# Patient Record
Sex: Male | Born: 1935
Health system: Southern US, Community
[De-identification: ages and names within clinical notes are randomized; demographics above are authoritative.]

## PROBLEM LIST (undated history)

## (undated) DIAGNOSIS — G20A1 Parkinson's disease without dyskinesia, without mention of fluctuations: Secondary | ICD-10-CM

## (undated) DIAGNOSIS — N400 Enlarged prostate without lower urinary tract symptoms: Secondary | ICD-10-CM

## (undated) DIAGNOSIS — I10 Essential (primary) hypertension: Secondary | ICD-10-CM

## (undated) DIAGNOSIS — E785 Hyperlipidemia, unspecified: Secondary | ICD-10-CM

## (undated) DIAGNOSIS — F0280 Dementia in other diseases classified elsewhere without behavioral disturbance: Secondary | ICD-10-CM

## (undated) DIAGNOSIS — G20 Parkinson's disease: Secondary | ICD-10-CM

## (undated) HISTORY — DX: Hyperlipidemia, unspecified: E78.5

## (undated) HISTORY — DX: Parkinson's disease without dyskinesia, without mention of fluctuations: G20.A1

## (undated) HISTORY — DX: Essential (primary) hypertension: I10

## (undated) HISTORY — PX: TONSILLECTOMY: SUR1361

## (undated) HISTORY — DX: Parkinson's disease: G20

## (undated) HISTORY — DX: Benign prostatic hyperplasia without lower urinary tract symptoms: N40.0

## (undated) HISTORY — DX: Dementia in other diseases classified elsewhere, unspecified severity, without behavioral disturbance, psychotic disturbance, mood disturbance, and anxiety: F02.80

---

## 2013-06-09 DIAGNOSIS — G20 Parkinson's disease: Secondary | ICD-10-CM | POA: Diagnosis not present

## 2013-06-09 DIAGNOSIS — E785 Hyperlipidemia, unspecified: Secondary | ICD-10-CM | POA: Diagnosis not present

## 2013-08-25 DIAGNOSIS — G20 Parkinson's disease: Secondary | ICD-10-CM | POA: Diagnosis not present

## 2013-12-04 DIAGNOSIS — Z23 Encounter for immunization: Secondary | ICD-10-CM | POA: Diagnosis not present

## 2014-01-08 DIAGNOSIS — G20 Parkinson's disease: Secondary | ICD-10-CM | POA: Diagnosis not present

## 2014-01-08 DIAGNOSIS — R413 Other amnesia: Secondary | ICD-10-CM | POA: Diagnosis not present

## 2014-05-16 DIAGNOSIS — G20 Parkinson's disease: Secondary | ICD-10-CM | POA: Diagnosis not present

## 2014-05-16 DIAGNOSIS — R413 Other amnesia: Secondary | ICD-10-CM | POA: Diagnosis not present

## 2014-05-21 DIAGNOSIS — E785 Hyperlipidemia, unspecified: Secondary | ICD-10-CM | POA: Diagnosis not present

## 2014-08-20 ENCOUNTER — Telehealth: Payer: Self-pay | Admitting: Behavioral Health

## 2014-08-20 NOTE — Telephone Encounter (Signed)
Unable to reach patient at time of Pre-Visit Call.  Left message for patient to return call when available.    

## 2014-08-21 ENCOUNTER — Telehealth: Payer: Self-pay | Admitting: Family Medicine

## 2014-08-21 ENCOUNTER — Ambulatory Visit: Payer: Medicare Other | Admitting: Family Medicine

## 2014-08-21 DIAGNOSIS — Z0289 Encounter for other administrative examinations: Secondary | ICD-10-CM

## 2014-08-23 NOTE — Telephone Encounter (Signed)
Pt was no show 08/21/14 8:30am, new pt, pt rescheduled new pt with Selena Batten 09/14/14, pt came in late (got lost) does not want to be charged (wife was crying)/jms/ rescheduled, charge for no show?

## 2014-08-23 NOTE — Telephone Encounter (Signed)
Ok to charge 

## 2014-09-13 ENCOUNTER — Telehealth: Payer: Self-pay | Admitting: Behavioral Health

## 2014-09-13 ENCOUNTER — Encounter: Payer: Self-pay | Admitting: Behavioral Health

## 2014-09-13 NOTE — Telephone Encounter (Signed)
Unable to reach patient at time of Pre-Visit Call.  Left message for patient to return call when available.    

## 2014-09-13 NOTE — Telephone Encounter (Signed)
Pre-Visit Call completed with patient and chart updated.   Pre-Visit Info documented in Specialty Comments under [employer].    

## 2014-09-13 NOTE — Addendum Note (Signed)
Addended by: Melanee Spry on: 09/13/2014 10:56 AM   Modules accepted: Medications

## 2014-09-14 ENCOUNTER — Encounter: Payer: Self-pay | Admitting: Physician Assistant

## 2014-09-14 ENCOUNTER — Ambulatory Visit (INDEPENDENT_AMBULATORY_CARE_PROVIDER_SITE_OTHER): Payer: Medicare Other | Admitting: Physician Assistant

## 2014-09-14 VITALS — BP 132/78 | HR 71 | Temp 98.5°F | Resp 18 | Ht 71.0 in | Wt 188.0 lb

## 2014-09-14 DIAGNOSIS — G20 Parkinson's disease: Secondary | ICD-10-CM | POA: Diagnosis not present

## 2014-09-14 DIAGNOSIS — F0280 Dementia in other diseases classified elsewhere without behavioral disturbance: Secondary | ICD-10-CM | POA: Diagnosis not present

## 2014-09-14 DIAGNOSIS — G20A1 Parkinson's disease without dyskinesia, without mention of fluctuations: Secondary | ICD-10-CM

## 2014-09-14 DIAGNOSIS — N400 Enlarged prostate without lower urinary tract symptoms: Secondary | ICD-10-CM

## 2014-09-14 MED ORDER — SILODOSIN 4 MG PO CAPS: 4.0000 mg | ORAL_CAPSULE | Freq: Every day | ORAL | Status: DC

## 2014-09-14 NOTE — Progress Notes (Signed)
Pre visit review using our clinic review tool, if applicable. No additional management support is needed unless otherwise documented below in the visit note/SLS  

## 2014-09-14 NOTE — Patient Instructions (Signed)
Please continue medications as directed but stop the Flomax and begin Rapaflo as directed.    You will be contacted for assessment by Neurology. Please keep your phone on.  Follow-up in 1 month.

## 2014-09-14 NOTE — Progress Notes (Signed)
Patient presents to clinic today to establish care.  Acute Concerns: BPH with nocturia. Patient and wife note patient has been on Flomax for urinary symptoms for > 5 years. Previously worked well but now with some increased nocturia. No change to daytime urination. Wife worried about falls due to increased nighttime bathroom trips as patient with Parkinson's. Denies dysuria, hematuria, pain. Symptoms have been steady for > 6 months.  Chronic Issues: Parkinson's Disease -- requesting referral to Neurology in the area. Currently on Sinemet IR as directed. Is currently on Aricept for dementia associated with this chronic disease. Denies recent fall but there are still gait issues due to advancing disease.   Past Medical History  Diagnosis Date  . Parkinson's disease   . Enlarged prostate   . Hyperlipidemia   . Hypertension     Past Surgical History  Procedure Laterality Date  . Tonsillectomy      Current Outpatient Prescriptions on File Prior to Visit  Medication Sig Dispense Refill  . calcium carbonate (OS-CAL) 600 MG TABS tablet Take 600 mg by mouth 2 (two) times daily with a meal.    . carbidopa-levodopa (SINEMET IR) 25-100 MG per tablet Take 2 tablets by mouth 3 (three) times daily.     . Cholecalciferol (VITAMIN D-3 PO) Take by mouth at bedtime.    . donepezil (ARICEPT) 10 MG tablet Take 10 mg by mouth daily.     . Multiple Vitamins-Minerals (MULTIVITAMIN ADULT PO) Take by mouth daily.    . Naproxen Sodium (ALEVE PO) Take by mouth 2 (two) times daily.    . rosuvastatin (CRESTOR) 20 MG tablet Take 20 mg by mouth daily.    . tamsulosin (FLOMAX) 0.4 MG CAPS capsule Take 0.4 mg by mouth at bedtime.      No current facility-administered medications on file prior to visit.    Allergies  Allergen Reactions  . Mirapex [Pramipexole Dihydrochloride] Swelling    Family History  Problem Relation Age of Onset  . Lupus Daughter   . Healthy Daughter     x2    Social History     Social History  . Marital Status: Married    Spouse Name: N/A  . Number of Children: N/A  . Years of Education: N/A   Occupational History  . Not on file.   Social History Main Topics  . Smoking status: Never Smoker   . Smokeless tobacco: Not on file  . Alcohol Use: Not on file  . Drug Use: Not on file  . Sexual Activity: Not on file   Other Topics Concern  . Not on file   Social History Narrative   Review of Systems  Constitutional: Negative for fever and weight loss.  Cardiovascular: Negative for chest pain and palpitations.  Gastrointestinal: Negative for heartburn, nausea, vomiting, abdominal pain, diarrhea, constipation, blood in stool and melena.  Genitourinary: Positive for frequency. Negative for dysuria, urgency, hematuria and flank pain.  Neurological: Negative for dizziness and loss of consciousness.  Psychiatric/Behavioral: Positive for memory loss. The patient is not nervous/anxious.     BP 132/78 mmHg  Pulse 71  Temp(Src) 98.5 F (36.9 C) (Oral)  Resp 18  Ht 5\' 11"  (1.803 m)  Wt 188 lb (85.276 kg)  BMI 26.23 kg/m2  SpO2 96%  Physical Exam  Constitutional: He is well-developed, well-nourished, and in no distress.  HENT:  Head: Normocephalic and atraumatic.  Eyes: Conjunctivae are normal.  Neck: Neck supple.  Cardiovascular: Normal rate, regular rhythm, normal heart  sounds and intact distal pulses.   Pulmonary/Chest: Effort normal and breath sounds normal.  Abdominal: Soft. Bowel sounds are normal.  Neurological: He is alert.  Psychiatric: Affect normal.  Vitals reviewed.   No results found for this or any previous visit (from the past 2160 hour(s)).  Assessment/Plan: No problem-specific assessment & plan notes found for this encounter.

## 2014-09-17 DIAGNOSIS — F0280 Dementia in other diseases classified elsewhere without behavioral disturbance: Secondary | ICD-10-CM | POA: Insufficient documentation

## 2014-09-17 DIAGNOSIS — G20 Parkinson's disease: Secondary | ICD-10-CM | POA: Insufficient documentation

## 2014-09-17 DIAGNOSIS — N400 Enlarged prostate without lower urinary tract symptoms: Secondary | ICD-10-CM | POA: Insufficient documentation

## 2014-09-17 NOTE — Assessment & Plan Note (Signed)
Continue Sinemet. Referral to Neurology placed.

## 2014-09-17 NOTE — Assessment & Plan Note (Signed)
Will attempt trial of Rapaflo 4 mg nightly. Supportive measures reviewed. If not improving recommend Urology referral for assessment giving advancing dementia.

## 2014-09-19 ENCOUNTER — Telehealth: Payer: Self-pay | Admitting: Neurology

## 2014-09-19 NOTE — Telephone Encounter (Signed)
Will get release when patient comes to appt.

## 2014-09-19 NOTE — Telephone Encounter (Signed)
Consuella Lose from [facility] called in regards to PT and the request for medical records and she needs a signed medical release form and she can get them faxed over to you/Dawn CB# [phone] Ext:[phone]

## 2014-09-21 ENCOUNTER — Telehealth: Payer: Self-pay | Admitting: *Deleted

## 2014-09-21 NOTE — Telephone Encounter (Signed)
Consuella Lose called requesting a signed release form so that she can send patient's records.  Fax: [phone]

## 2014-09-21 NOTE — Telephone Encounter (Signed)
Will send when patient is seen next week.

## 2014-09-27 ENCOUNTER — Encounter: Payer: Self-pay | Admitting: Neurology

## 2014-09-27 ENCOUNTER — Ambulatory Visit (INDEPENDENT_AMBULATORY_CARE_PROVIDER_SITE_OTHER): Payer: Medicare Other | Admitting: Neurology

## 2014-09-27 VITALS — BP 118/80 | HR 68 | Ht 70.0 in | Wt 192.0 lb

## 2014-09-27 DIAGNOSIS — G20 Parkinson's disease: Secondary | ICD-10-CM

## 2014-09-27 DIAGNOSIS — F0280 Dementia in other diseases classified elsewhere without behavioral disturbance: Secondary | ICD-10-CM
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
## 2014-10-12 ENCOUNTER — Ambulatory Visit (INDEPENDENT_AMBULATORY_CARE_PROVIDER_SITE_OTHER): Payer: Medicare Other | Admitting: Physician Assistant

## 2014-10-12 ENCOUNTER — Encounter: Payer: Self-pay | Admitting: Physician Assistant

## 2014-10-12 VITALS — BP 142/82 | HR 68 | Temp 98.3°F | Resp 18 | Ht 70.0 in | Wt 191.2 lb

## 2014-10-12 DIAGNOSIS — N400 Enlarged prostate without lower urinary tract symptoms: Secondary | ICD-10-CM

## 2014-10-12 DIAGNOSIS — Z23 Encounter for immunization: Secondary | ICD-10-CM

## 2014-10-12 MED ORDER — SILODOSIN 4 MG PO CAPS: 4.0000 mg | ORAL_CAPSULE | Freq: Every day | ORAL | Status: DC

## 2014-10-12 NOTE — Patient Instructions (Signed)
You were given your flu shot today.  I am glad you are doing better on the Rapaflo. Please continue the medication as directed. Follow-up if symptoms deteriorate.

## 2014-10-12 NOTE — Progress Notes (Signed)
Pre visit review using our clinic review tool, if applicable. No additional management support is needed unless otherwise documented below in the visit note/SLS  

## 2014-10-12 NOTE — Progress Notes (Signed)
    Patient presents to clinic today for follow-up of BPH after starting Rapaflo 4 mg nightly. Is taking as directed. Notes is working better than his Flomax without making him lightheaded. Endorses nocturia at 1, down from 4 times nightly. Is happy with results.  Needs flu shot. Will be given today.  Past Medical History  Diagnosis Date  . Parkinson's disease   . Enlarged prostate   . Hyperlipidemia   . Hypertension     Current Outpatient Prescriptions on File Prior to Visit  Medication Sig Dispense Refill  . calcium carbonate (OS-CAL) 600 MG TABS tablet Take 600 mg by mouth 2 (two) times daily with a meal.    . carbidopa-levodopa (SINEMET IR) 25-100 MG per tablet Take 2 tablets by mouth 3 (three) times daily.     . Cholecalciferol (VITAMIN D-3 PO) Take by mouth at bedtime.    . docusate sodium (COLACE) 100 MG capsule Take 300 mg by mouth daily.    Marland Kitchen donepezil (ARICEPT) 10 MG tablet Take 10 mg by mouth daily.     . Multiple Vitamins-Minerals (MULTIVITAMIN ADULT PO) Take by mouth daily.    . Naproxen Sodium (ALEVE PO) Take by mouth 2 (two) times daily.    . rosuvastatin (CRESTOR) 20 MG tablet Take 20 mg by mouth daily.     No current facility-administered medications on file prior to visit.    Allergies  Allergen Reactions  . Mirapex [Pramipexole Dihydrochloride] Swelling    Family History  Problem Relation Age of Onset  . Lupus Daughter   . Healthy Daughter     x2    Social History   Social History  . Marital Status: Married    Spouse Name: N/A  . Number of Children: N/A  . Years of Education: N/A   Social History Main Topics  . Smoking status: Never Smoker   . Smokeless tobacco: None  . Alcohol Use: 0.0 oz/week    0 Standard drinks or equivalent per week     Comment: 2 glasses of wine weekly, varies  . Drug Use: No  . Sexual Activity: Not Asked   Other Topics Concern  . None   Social History Narrative    Review of Systems - See HPI.  All other ROS are  negative.  BP 142/82 mmHg  Pulse 68  Temp(Src) 98.3 F (36.8 C) (Oral)  Resp 18  Ht  (1.778 m)  Wt 191 lb 4 oz (86.75 kg)  BMI 27.44 kg/m2  SpO2 95%  Physical Exam  Constitutional: He is oriented to person, place, and time and well-developed, well-nourished, and in no distress.  HENT:  Head: Normocephalic and atraumatic.  Eyes: Conjunctivae are normal.  Pulmonary/Chest: Effort normal and breath sounds normal.  Neurological: He is alert and oriented to person, place, and time.  Skin: Skin is warm and dry. No rash noted.  Psychiatric: Affect normal.  Vitals reviewed.   No results found for this or any previous visit (from the past 2160 hour(s)).  Assessment/Plan: BPH (benign prostatic hyperplasia)  Improvement in symptoms with Rapaflo. Will continue current regimen. Follow-up if symptoms deteriorate.

## 2014-10-12 NOTE — Assessment & Plan Note (Signed)
Improvement in symptoms with Rapaflo. Will continue current regimen. Follow-up if symptoms deteriorate.

## 2014-11-05 NOTE — Telephone Encounter (Signed)
Pt wife was told today by Dr. Abner GreenspanBlyth that the $50 would be waived.

## 2014-12-28 ENCOUNTER — Encounter: Payer: Self-pay | Admitting: Neurology

## 2014-12-28 ENCOUNTER — Ambulatory Visit (INDEPENDENT_AMBULATORY_CARE_PROVIDER_SITE_OTHER): Payer: Medicare Other | Admitting: Neurology

## 2014-12-28 VITALS — BP 150/84 | HR 96 | Wt 204.0 lb

## 2014-12-28 DIAGNOSIS — G20 Parkinson's disease: Secondary | ICD-10-CM

## 2014-12-28 DIAGNOSIS — F0280 Dementia in other diseases classified elsewhere without behavioral disturbance: Secondary | ICD-10-CM | POA: Diagnosis not present

## 2014-12-28 DIAGNOSIS — G20A1 Parkinson's disease without dyskinesia, without mention of fluctuations: Secondary | ICD-10-CM

## 2014-12-28 MED ORDER — CARBIDOPA-LEVODOPA 25-100 MG PO TABS: 2.0000 | ORAL_TABLET | Freq: Three times a day (TID) | ORAL | Status: DC

## 2014-12-28 MED ORDER — DONEPEZIL HCL 10 MG PO TABS: 10.0000 mg | ORAL_TABLET | Freq: Every day | ORAL | Status: DC

## 2014-12-28 NOTE — Patient Instructions (Signed)
1. You have been referred to [facility]. They will call you directly to schedule an appointment.  Please call [phone] if you do not hear from them.    
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
## 2015-01-23 ENCOUNTER — Ambulatory Visit: Payer: Medicare Other | Admitting: Occupational Therapy

## 2015-01-23 ENCOUNTER — Ambulatory Visit: Payer: Medicare Other | Admitting: Physical Therapy

## 2015-01-23 ENCOUNTER — Encounter: Payer: Medicare Other | Admitting: Speech Pathology

## 2015-02-19 ENCOUNTER — Ambulatory Visit: Payer: Medicare Other | Admitting: Speech Pathology

## 2015-02-19 ENCOUNTER — Ambulatory Visit: Payer: Medicare Other | Attending: Neurology | Admitting: Occupational Therapy

## 2015-02-19 ENCOUNTER — Ambulatory Visit: Payer: Medicare Other | Admitting: Physical Therapy

## 2015-02-19 DIAGNOSIS — R2689 Other abnormalities of gait and mobility: Secondary | ICD-10-CM | POA: Diagnosis not present

## 2015-02-19 DIAGNOSIS — R29898 Other symptoms and signs involving the musculoskeletal system: Secondary | ICD-10-CM

## 2015-02-19 DIAGNOSIS — R278 Other lack of coordination: Secondary | ICD-10-CM

## 2015-02-19 DIAGNOSIS — R41841 Cognitive communication deficit: Secondary | ICD-10-CM

## 2015-02-19 DIAGNOSIS — R258 Other abnormal involuntary movements: Secondary | ICD-10-CM | POA: Diagnosis not present

## 2015-02-19 DIAGNOSIS — R269 Unspecified abnormalities of gait and mobility: Secondary | ICD-10-CM | POA: Insufficient documentation

## 2015-02-19 DIAGNOSIS — R2681 Unsteadiness on feet: Secondary | ICD-10-CM | POA: Diagnosis not present

## 2015-02-19 DIAGNOSIS — R471 Dysarthria and anarthria: Secondary | ICD-10-CM | POA: Insufficient documentation

## 2015-02-19 DIAGNOSIS — R293 Abnormal posture: Secondary | ICD-10-CM

## 2015-02-19 DIAGNOSIS — R4189 Other symptoms and signs involving cognitive functions and awareness: Secondary | ICD-10-CM | POA: Insufficient documentation

## 2015-02-19 DIAGNOSIS — R279 Unspecified lack of coordination: Secondary | ICD-10-CM | POA: Insufficient documentation

## 2015-02-19 NOTE — Patient Instructions (Signed)
  Loud "AH" 5x twice a day  Count to 10 Days of week Months of year   SLOW and BIG   Cognitive Activities you can do at home:   - Solitaire  - Majong  - Scrabble  - Chess/Checkers  - Crosswords (easy level)  - Education officer, community  - Card Games  - Board Games  - Connect 4  - Simon  - the [employer]  On your computer, tablet or phone:  Metallurgist it out [employer] the Word?App

## 2015-02-19 NOTE — Therapy (Signed)
[facility] [address] [place], [place], [address] Phone: [phone]   Fax:  [phone]  Occupational Therapy Evaluation  Patient Details  Name: Jacob Tran MRN: [id number] Date of Birth: 01/06/1934 Referring Provider: Dr. Lurena Joiner Tat  Encounter Date: 02/19/2015      OT End of Session - 02/19/15 2042    Visit Number 1   Number of Visits 17   Date for OT Re-Evaluation 04/19/15   Authorization Type Medicare/BCBS, need G code   Authorization - Visit Number 1   Authorization - Number of Visits 10   OT Start Time 1538   OT Stop Time 1625   OT Time Calculation (min) 47 min   Activity Tolerance Patient tolerated treatment well   Behavior During Therapy Flat affect  decr initiation      Past Medical History  Diagnosis Date  . Parkinson's disease (HCC)   . Enlarged prostate   . Hyperlipidemia   . Hypertension     Past Surgical History  Procedure Laterality Date  . Tonsillectomy      There were no vitals filed for this visit.  Visit Diagnosis:  Bradykinesia  Rigidity  Decreased coordination  Decreased functional mobility  Unsteadiness  Cognitive deficits  Posture abnormality      Subjective Assessment - 02/19/15 1542    Subjective  Pt's wife reports primary concern with memory and walking.  Pt does not initiate conversation and only answers with short phases when asked about ADLs or states that "I don't remember"    Patient is accompained by: Family member  wife   Pertinent History PD (diagnosed since 2010); Parkinson's disease dementia, hx of "back problems"   Limitations PD dementia   Patient Stated Goals improve speech, walking, and decr time for ADLs   Currently in Pain? No/denies           [facility] OT Assessment - 02/19/15 1543    Assessment   Diagnosis Parkinson's disease with Parkinson's disease dementia   Referring Provider Dr. Lurena Joiner Tat   Onset Date --  2010 initially  diagnosed   Prior Therapy none for PD   Precautions   Precautions Fall  PD dementia   Balance Screen   Has the patient fallen in the past 6 months Yes   How many times? 1  slid off toilet   Home  Environment   Family/patient expects to be discharged to: Private residence   Home Layout --  no stairs, townhouse   Lives With Spouse   Prior Function   Level of Independence Independent with household mobility without device;Independent with basic ADLs   Vocation Retired   Leisure Per wife, he plays Management consultant.  Has exercise bike at home, but does not use.   ADL   Eating/Feeding Modified independent  min spills, incr time   Grooming --  incr time, occasional assist with shaving chinline   Upper Body Bathing Modified independent   Lower Body Bathing Modified independent   Upper Body Dressing Increased time   Lower Body Dressing Increased time   Toilet Tranfer Modified independent   Toileting - Clothing Manipulation Modified independent   Toileting -  Hygiene Modified Independent   Tub/Shower Transfer Modified independent   Tub/Shower Transfer Equipment --  has shower seat but does not use   IADL   Prior Level of Function Shopping wife performs    Prior Level of Function Light Housekeeping wife performs, and has been for years   Light Housekeeping --  pt makes bed   Prior Level of Function Meal Prep wife performs   Meal Prep --  microwave, simple snack prep   Community Mobility --  wife drives   Medication Management Takes responsibility if medication is prepared in advance in seperate dosage  with supervision   Financial Management --  wife performs   Mobility   Mobility Status Freezing;History of falls   Mobility Status Comments Ambulates without device, but per wife pt shuffles/walks on toes and freezes when walking (more with fatigue per wife)   Written Expression   Dominant Hand Right   Handwriting Mild micrographia  100% initially, decr the more pt writes    Vision - History   Baseline Vision Bifocals   Vision Assessment   Diplopia Assessment --  denies   Cognition   Overall Cognitive Status --  PD dementia, also see ST eval, min cues for directions   Area of Impairment Orientation;Attention;Memory;Awareness  difficulty with unafamiliar activiites, decr initiation   General Comments only answers in short phases, or states that he doesn't remember/know, does not initiate speech, or does not answer or looks to wife   Behaviors --  decr initiation   Observation/Other Assessments   Observations Pt with forward stooped posture and leans to the right when standing   Other Surveys  Select   Outcome Measures Buttoning/unbuttoning 3 buttons on table in 42.0sec   Physical Performance Test   Yes   Simulated Eating Time (seconds) 17.63   Donning Doffing Jacket Time (seconds) 24.03  however, when donning after assessment, appeared to freeze    Coordination   9 Hole Peg Test Right;Left   Right 9 Hole Peg Test 50.75sec   Left 9 Hole Peg Test 45.28   Box and Blocks R-38blocks, L-30blocks   Tremors controlled by meds per wife   Tone   Assessment Location Right Upper Extremity;Left Upper Extremity   ROM / Strength   AROM / PROM / Strength AROM   AROM   Overall AROM  Within functional limits for tasks performed   Overall AROM Comments however, needed cueing for full R elbow extension and shoulder flex/abduction   RUE Tone   RUE Tone Moderate   LUE Tone   LUE Tone --  min-mod                         OT Education - 02/19/15 2017    Education provided Yes   Education Details results of eval and POC; importance of exercise; recommended daily exercise and ways to incorporate it into daily routine; education that wife may need to cue pt due to decr initiation/apathy with PD and memory deficits   Person(s) Educated Patient;Spouse   Methods Explanation   Comprehension Verbalized understanding          OT Short Term Goals -  02/19/15 2053    OT SHORT TERM GOAL #1   Title Pt/wife will be independent with PD-specific HEP.--check STGs 03/20/15   Time 4   Period Weeks   Status New   OT SHORT TERM GOAL #2   Title Pt/wife will verbalize understanding of ways to prevent future complications and appropriate community resources.   Time 4   Period Weeks   Status New   OT SHORT TERM GOAL #3   Title Pt will improve coordination for ADLs as shown by improving time on 9-hole peg test by at least 4sec bilaterally.   Baseline R-50.75sec, L-45.28sec   Time 4  Period Weeks   Status New           OT Long Term Goals - 2015/03/03 06/03/54    OT LONG TERM GOAL #1   Title Pt/wife will verbalize understanding of AE/strategies to incr ease with ADLs prn.--check LTGs 04/19/15   Time 8   Period Weeks   Status New   OT LONG TERM GOAL #2   Title Pt will improve functional reaching for ADLs as shown by improving score on box and blocks test by at least 5 with LUE.   Baseline 30 blocks   Period Weeks   Status New   OT LONG TERM GOAL #3   Title Pt will improve coordination for ADLs as shown by improving time on 9-hole peg test by at least 8 sec with RUE.   Baseline 50.75sec   Period Weeks   Status New   OT LONG TERM GOAL #4   Title Pt will be able to fasten/unfasten 3 buttons on table by at least 4sec.   Baseline 42.0sec   Time 8   Period Weeks   Status New   OT LONG TERM GOAL #5   Title Pt will demo decr bradykinesia as shown by demo full R elbow extension with overhead reaching at least 90% of the time.   Time 8   Period Weeks   Status New               Plan - 03-Mar-2015 06-03-43    Clinical Impression Statement Pt is a 82 y.o. male with diagnosis of Parkinson's disease and PD dementia.  Wife reports hx of "back problems" as well.  Pt presents with bradykinesia, rigidity, decr posture, decr balance/functional mobility for ADLs, decr coordination, and cognitive deficits.  Pt would benefit from occupational therapy to  address these deficit to improve ease with ADLs, prevent future complications, establish PD-specific HEP, and reduce caregiver burden.   Pt will benefit from skilled therapeutic intervention in order to improve on the following deficits (Retired) Decreased cognition;Decreased mobility;Decreased strength;Impaired UE functional use;Decreased knowledge of use of DME;Decreased balance;Decreased activity tolerance;Impaired tone;Decreased coordination;Decreased range of motion  bradykinesia   Rehab Potential Good   Clinical Impairments Affecting Rehab Potential PD dementia/cognitive deficits, decr initiation   OT Frequency 2x / week   OT Duration 8 weeks  +eval   OT Treatment/Interventions Self-care/ADL training;Therapeutic exercise;Functional Mobility Training;Patient/family education;Balance training;Splinting;Manual Therapy;Neuromuscular education;Ultrasound;Energy conservation;Therapeutic exercises;Therapeutic activities;DME and/or AE instruction;Cryotherapy;Electrical Stimulation;Fluidtherapy;Cognitive remediation/compensation;Visual/perceptual remediation/compensation;Passive range of motion;Contrast Bath;Moist Heat   Plan initiate PWR! HEP in sitting, PWR! hands   Recommended Other Services also evaluated by ST and PT 03/03/2015   Consulted and Agree with Plan of Care Patient          G-Codes - 03-03-2015 06/02/2101    Functional Assessment Tool Used 9-hole peg test:  R-50.75sec, L-45.28sec; box and blocks test:  R-38, L-30 blocks, only approx 90% R shoulder flex and elbow ext without cueing   Functional Limitation Carrying, moving and handling objects   Carrying, Moving and Handling Objects Current Status [phone]) At least 40 percent but less than 60 percent impaired, limited or restricted   Carrying, Moving and Handling Objects Goal Status ([id number]) At least 20 percent but less than 40 percent impaired, limited or restricted      Problem List Patient Active Problem List   Diagnosis Date Noted  .  Parkinson's disease dementia (HCC) 09/17/2014  . BPH (benign prostatic hyperplasia) 09/17/2014  . Parkinson's disease (HCC) 09/17/2014    [facility] 2015-03-03, 9:06  PM  [facility] [address] [place], [place], [address] Phone: [phone]   Fax:  [phone]  Name: Jacob Tran MRN: [id number] Date of Birth: 1933/02/19  Willa Frater, OTR/L 02/19/2015 9:08 PM

## 2015-02-19 NOTE — Therapy (Signed)
[facility] [address] [place], [place], [address] Phone: [phone]   Fax:  [phone]  Speech Language Pathology Evaluation  Patient Details  Name: Jacob Tran MRN: [id number] Date of Birth: 11-Jun-1933 Referring Provider: Nyoka Lint Tat  Encounter Date: 02/19/2015      End of Session - 02/19/15 1454    Visit Number 1   Number of Visits 17   Date for SLP Re-Evaluation 04/16/15   Activity Tolerance Patient tolerated treatment well      Past Medical History  Diagnosis Date  . Parkinson's disease (HCC)   . Enlarged prostate   . Hyperlipidemia   . Hypertension     Past Surgical History  Procedure Laterality Date  . Tonsillectomy      There were no vitals filed for this visit.  Visit Diagnosis: Dysarthria - Plan: SLP plan of care cert/re-cert  Cognitive communication deficit - Plan: SLP plan of care cert/re-cert      Subjective Assessment - 02/19/15 1416    Subjective "He sometimes won't talk to me at all"   Currently in Pain? No/denies            SLP Evaluation [facility] - 02/19/15 1416    SLP Visit Information   SLP Received On 02/19/15   Referring Provider Rebeccal Tat   Onset Date 08/2008   Medical Diagnosis Parkinson's Disease   Subjective   Subjective "He will go all day and not say 10 words to me"   Patient/Family Stated Goal "I just want him to talk to me"   General Information   Mobility Status walks independently - PT evaluated today   Prior Functional Status   Cognitive/Linguistic Baseline Baseline deficits   Type of Home Apartment    Lives With Spouse   Available Support Family   Vocation Retired   IT consultant   Overall Cognitive Status Impaired/Different from baseline   Area of Impairment Orientation;Attention;Memory;Awareness   Reading Comprehension   Reading Status Within funtional limits   Expression   Primary Mode of Expression Verbal   Verbal Expression   Overall Verbal  Expression Impaired   Initiation Impaired   Automatic Speech --  WFL   Repetition No impairment   Naming No impairment   Pragmatics Impairment   Written Expression   Dominant Hand Right   Written Expression Within Functional Limits   Oral Motor/Sensory Function   Overall Oral Motor/Sensory Function Impaired   Labial ROM Within Functional Limits   Lingual Coordination Reduced   Velum Within Functional Limits  hypernasality noted   Motor Speech   Overall Motor Speech Impaired   Respiration Within functional limits   Phonation Low vocal intensity   Resonance Hypernasality   Articulation Impaired   Level of Impairment Phrase   Intelligibility Intelligibility reduced   Word 75-100% accurate   Phrase 75-100% accurate   Sentence 50-74% accurate   Conversation 50-74% accurate   Motor Planning Witnin functional limits   Motor Speech Errors Unaware   Interfering Components Hearing loss   Effective Techniques Slow rate;Increased vocal intensity                         SLP Education - 02/19/15 1454    Education provided Yes   Education Details cogntive activities to do at home, loud /a/, goals for therapy   Person(s) Educated Patient;Spouse   Methods Explanation;Demonstration;Handout   Comprehension Verbalized understanding;Need further instruction;Verbal cues required  SLP Short Term Goals - 02/19/15 1607    SLP SHORT TERM GOAL #1   Title Pt. will demonstrate loud /a/ average of 86dB over 3 sessions.   Time 4   Period Weeks   Status New   SLP SHORT TERM GOAL #2   Title Pt will demonstrate average of 70 dB 85% of structured speech tasks with rare min A   Time 4   Period Weeks   Status New   SLP SHORT TERM GOAL #3   Title Pt will respond with 3-4 word utterances during structured speech tasks with occasional min A   Time 4   Period Weeks   Status New   SLP SHORT TERM GOAL #4   Title Pt will initiate social greeting in the clinic 1x over 3  sessions with rare min A.   Time 4   Period Weeks   Status New          SLP Long Term Goals - 02/19/15 1610    SLP LONG TERM GOAL #1   Title Pt will average 70dB during simple conversation over 5 minutes with occasional min A   Time 8   Period Weeks   Status New   SLP LONG TERM GOAL #2   Title Pt will request help or tool needed (ie pen, paper, calculator etc) during structured task with rare min A.   Time 8   Period Weeks   Status New   SLP LONG TERM GOAL #3   Title Pt will use 3-4 word utterances in simple conversation over 5 minutes with occasional min cues.    Time 8   Period Weeks   Status New          Plan - 02/19/15 1537    Clinical Impression Statement Mr. Grassia, a 82 y.o. male diagnosed with Parkinson's disease in 08/2008 presents today with moderate hypokinetic dysarthria and reduced initiation of verbal expression. A sound level meter placed 30cm from pt's mouth revealed an average of  68dB at phrase level, answering conversational questions.(70dB is [facility] for conversation) Mr. Stankowski's loud /a/ averaged 77dB initially. Upon instruction and occasional min cues, this increased to an average of 85dB.  Rapid rate of speech and hypernasality are noted on naming tasks and in simple conversation. He is judged to be 85% intelligible in quiet environment. Lingual tremors and discoordination noted. Velum elevated symmetrically.    Mrs. Riehl's primary complaint re: her husbands speech and language is his extermely sparse verbal output. She reports tearfully that  he says less than 10 words to her during a day. Assessment of Mr. Callaham's language reveal naming,repetition, reading, writing to dictation to be relatively intact for simple tasks . He performed simple abstract reasoning with occasional min cues.   Mr. Frieden does have h/o PD induced dementia with memory impairment. He was not oriented to the year or date. Long term memory also appeared impaired  When I asked about personal  events of his life.  Mr. Wimer demonstrated adequate sustained and selective attention on written task (sentence unscrambles).  Mr. Prout required mod to max cues to elaborate on answers, providing usually 1-2 word answers or remaining silent for open ended questions.  His  verbal output is not consistent with this relatively intact language , comprehension and attention, I suspect sparse communciative interaction  and severely reduced length of utterance is due to Parkinson's induced apathy.  Mrs. Caissie [name]) became tearful duirng this evaluation due to her husbands lack of  interaction and lack of participation in daily activities at home. She reports he won't leave the house and "plays video games all day." I recommend skilled ST to maximize this pt's intelligibility and verbal expression to reduce caregiver burden. Consider PD support group.   Speech Therapy Frequency 2x / week   Duration --  8 weeks   Treatment/Interventions Cognitive reorganization;Internal/external aids;Compensatory strategies;SLP instruction and feedback;Patient/family education;Functional tasks;Multimodal communcation approach   Potential to Achieve Goals Fair   Potential Considerations Cooperation/participation level;Medical prognosis   Consulted and Agree with Plan of Care Patient;Family member/caregiver   Family Member Consulted spouse, Clois Comber - 2015-02-22 [id number]    Functional Assessment Tool Used NOMS   Functional Limitations Motor speech   Motor Speech Current Status [phone]) At least 40 percent but less than 60 percent impaired, limited or restricted   Motor Speech Goal Status [phone]) At least 40 percent but less than 60 percent impaired, limited or restricted      Problem List Patient Active Problem List   Diagnosis Date Noted  . Parkinson's disease dementia (HCC) 09/17/2014  . BPH (benign prostatic hyperplasia) 09/17/2014  . Parkinson's disease (HCC) 09/17/2014    Lovvorn, Radene Journey MS,  CCC-SLP February 22, 2015, 4:17 PM  [place] [facility] [address] [place], [place], [address] Phone: [phone]   Fax:  [phone]  Name: Kepler Mccabe MRN: [id number] Date of Birth: 05/28/1933

## 2015-02-20 NOTE — Therapy (Signed)
[facility] [address] [place], [place], [address] Phone: [phone]   Fax:  [phone]  Physical Therapy Evaluation  Patient Details  Name: Jacob Tran MRN: [id number] Date of Birth: 10/05/1933 Referring Provider: Kerin Salen  Encounter Date: 02/19/2015      PT End of Session - 02/20/15 1535    Visit Number 1   Number of Visits 9   Date for PT Re-Evaluation 04/20/15   Authorization Type Medicare/BCBS-GCODE every 10th visit   PT Start Time 1318   PT Stop Time 1402   PT Time Calculation (min) 44 min   Equipment Utilized During Treatment Gait belt   Activity Tolerance Patient tolerated treatment well   Behavior During Therapy [facility] for tasks assessed/performed      Past Medical History  Diagnosis Date  . Parkinson's disease (HCC)   . Enlarged prostate   . Hyperlipidemia   . Hypertension     Past Surgical History  Procedure Laterality Date  . Tonsillectomy      There were no vitals filed for this visit.  Visit Diagnosis:  Posture abnormality  Bradykinesia  Rigidity  Abnormality of gait  Postural instability      Subjective Assessment - 02/19/15 1322    Subjective Pt is a 82 year old male who presents to OP PT with Parkinson's disease, who has had one fall in the pst 6 months.  That fall was trying to get off the toilet.  Pt does not use assistive device.  He has walker and cane, but does not use.   Patient is accompained by: Family member  wife   Patient Stated Goals Pt's goal is to help with gait-wife wants to help with shuffling gait, to stop sliding feet along.   Currently in Pain? No/denies            [facility] PT Assessment - 02/20/15 1527    Assessment   Medical Diagnosis Parkinson's disease   Referring Provider Tat, Lurena Joiner   Onset Date/Surgical Date --  moved to [facility] June 2016   Precautions   Precautions Fall  PD dementia   Balance Screen   Has the patient fallen in the  past 6 months Yes   How many times? 1   Has the patient had a decrease in activity level because of a fear of falling?  No   Is the patient reluctant to leave their home because of a fear of falling?  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  Has exercise bike at home, does not use.   Posture/Postural Control   Posture/Postural Control Postural limitations   Postural Limitations Rounded Shoulders;Flexed trunk;Forward head   ROM / Strength   AROM / PROM / Strength Strength   Strength   Overall Strength Comments Grossly tested at least 4/5 throughout bilateral lower extremities   Transfers   Transfers Sit to Stand;Stand to Sit   Sit to Stand 5: Supervision   Sit to Stand Details (indicate cue type and reason) Prefers use of UEs, but  is able to perform 5x sit<>stand   Five time sit to stand comments  11.81 sec = 2.77 ft/sec   Stand to Sit 5: Supervision   Comments Wife reports difficulty getting up from recliners and chairs at home; she reports increased reliance on use of hands then  needs family assistance.   Ambulation/Gait   Ambulation/Gait Yes   Ambulation/Gait Assistance 4: Min guard   Ambulation Distance (Feet) 220 Feet   Assistive device None   Gait Pattern Step-through pattern;Decreased arm swing - right;Decreased arm swing - left;Decreased step length - right;Decreased step length - left;Lateral hip instability;Decreased trunk rotation;Lateral trunk lean to right;Trunk flexed;Poor foot clearance - left;Poor foot clearance - right  Lateral L hip excursion  in stance phase   Ambulation Surface Level;Indoor   Gait velocity 10.28   Gait Comments Pt has increased forward flexed posture and increased shuffling type gait pattern as gait progressed in 2 minutes of walking.   Standardized Balance Assessment   Standardized Balance Assessment Timed Up and Go Test;Dynamic Gait Index   Dynamic Gait Index   Level Surface Mild Impairment   Change in Gait Speed Mild Impairment   Gait with Horizontal Head Turns Moderate Impairment   Gait with Vertical Head Turns Moderate Impairment   Gait and Pivot Turn Mild Impairment   Step Over Obstacle Moderate Impairment   Step Around Obstacles Moderate Impairment   Steps Mild Impairment   Total Score 12   DGI comment: Scores <19/24 are indicative of increased fall risk.   Timed Up and Go Test   Normal TUG (seconds) 14.66   Cognitive TUG (seconds) 16.62   TUG Comments Scores >13.5 sec and 15 sec (cognitive) are indicative of increased fall risk.                                PT Long Term Goals - 02/20/15 1546    PT LONG TERM GOAL #1   Title Pt will perform HEP with family's assistance for improved posture, balance, gait.  TARGET 03/21/15   Time 4   Period Weeks   Status New   PT LONG TERM GOAL #2   Title Pt will improve Timed Up and Go test to less than or equal to 13.5 seconds for decreased fall risk.   Time 4   Period Weeks   Status New   PT LONG TERM GOAL #3   Title Pt will improve Dynamic Gait Index to at least 15/24 for decreased fall risk.   Time 4   Period Weeks   Status New   PT LONG TERM GOAL #4   Title Pt will tolerate at least 5 minutes of walking activities without loss of balance due to increased forward flexed posture and shuffling gait.   Time 4   Period Weeks   Status New   PT LONG TERM GOAL #5   Title Pt/wife will verbalize understanidng of fall prevention within the home environment.   Time 4   Period Weeks   Status New   Additional Long Term Goals    Additional Long Term Goals Yes   PT LONG TERM GOAL #6   Title Pt/wife will verbalize understanding of local Parkinson's disease related resources, including community fitness options.   Time 4   Period Weeks   Status New   PT LONG TERM GOAL #7   Title Pt will perform at least 8 of 10 reps of sit<>stand transfers  with minimal to no UE support from 18 inch surfaces and lower, for improved efficiency and safety with transfers.   Time 4   Period Weeks   Status New               Plan - 02/20/15 1536    Clinical Impression Statement Pt is a 82 year old male who presents to OP PT with history of Parkinson's disease, with one fall in the past 6 months.  Wife reports difficulty getting up from chairs and increasing incidence of shuffling gait and forward flexed posture which she is afraid will lead to falls.  Pt presents to therapy eval with the following personal factors/co-morbidities:  PD dementia, HTN, Hyperlipidemia.  Wife also reports they moved from [place] to [place] in June 2016; she also reports decline in his independent exercise habits since wife fractured her foot in late 2015.  Pt presents with abnormal posture, shuffling gait/decreased timing and coordination of gait, decreased balance, decreased functional activity tolerance, limited participation in exercise activities within the community.  Pt's clinical presentation appears to be stable.  Pt does present as fall risk per TUG, TUG cognitive and Dynamic Gait Index scores.  Pt would benefit from skilled PT to address the above stated deficits to improve safety with functional mobility and decrease fall risk.   Pt will benefit from skilled therapeutic intervention in order to improve on the following deficits Abnormal gait;Decreased activity tolerance;Decreased balance;Decreased mobility;Decreased endurance;Decreased safety awareness;Decreased strength;Difficulty walking;Postural dysfunction   Rehab Potential Good   PT Frequency 2x  / week   PT Duration 4 weeks   PT Treatment/Interventions ADLs/Self Care Home Management;Therapeutic exercise;Therapeutic activities;Functional mobility training;Gait training;Balance training;Neuromuscular re-education;Patient/family education   PT Next Visit Plan Initiate HEP-posture stretching and strengthening, balance activities to improve step length and weightshifting   Consulted and Agree with Plan of Care Patient;Family member/caregiver   Family Member Consulted wife          G-Codes - 2015-03-08 1551    Functional Assessment Tool Used TUG 14.66 sec, TUG cog 16.62 sec, Dynamic Gait Index score 12/24   Functional Limitation Mobility: Walking and moving around   Mobility: Walking and Moving Around Current Status [phone]) At least 40 percent but less than 60 percent impaired, limited or restricted   Mobility: Walking and Moving Around Goal Status [phone]) At least 20 percent but less than 40 percent impaired, limited or restricted       Problem List Patient Active Problem List   Diagnosis Date Noted  . Parkinson's disease dementia (HCC) 09/17/2014  . BPH (benign prostatic hyperplasia) 09/17/2014  . Parkinson's disease (HCC) 09/17/2014    Daylee Delahoz W. 02/20/2015, 3:55 PM Gean Maidens., PT  [facility] [address] [place], [place], [address] Phone: [phone]   Fax:  [phone]  Name: Jacob Tran MRN: [id number] Date of Birth: [birth year]

## 2015-02-25 ENCOUNTER — Ambulatory Visit: Payer: Medicare Other | Attending: Neurology | Admitting: *Deleted

## 2015-02-25 ENCOUNTER — Encounter: Payer: Self-pay | Admitting: *Deleted

## 2015-02-25 VITALS — HR 88

## 2015-02-25 DIAGNOSIS — R2689 Other abnormalities of gait and mobility: Secondary | ICD-10-CM

## 2015-02-25 DIAGNOSIS — R293 Abnormal posture: Secondary | ICD-10-CM | POA: Diagnosis not present

## 2015-02-25 DIAGNOSIS — R471 Dysarthria and anarthria: Secondary | ICD-10-CM | POA: Insufficient documentation

## 2015-02-25 DIAGNOSIS — R279 Unspecified lack of coordination: Secondary | ICD-10-CM | POA: Insufficient documentation

## 2015-02-25 DIAGNOSIS — R41841 Cognitive communication deficit: Secondary | ICD-10-CM | POA: Diagnosis not present

## 2015-02-25 DIAGNOSIS — R4189 Other symptoms and signs involving cognitive functions and awareness: Secondary | ICD-10-CM | POA: Insufficient documentation

## 2015-02-25 DIAGNOSIS — R278 Other lack of coordination: Secondary | ICD-10-CM

## 2015-02-25 DIAGNOSIS — R258 Other abnormal involuntary movements: Secondary | ICD-10-CM | POA: Insufficient documentation

## 2015-02-25 DIAGNOSIS — R269 Unspecified abnormalities of gait and mobility: Secondary | ICD-10-CM | POA: Diagnosis not present

## 2015-02-25 DIAGNOSIS — R29898 Other symptoms and signs involving the musculoskeletal system: Secondary | ICD-10-CM | POA: Diagnosis not present

## 2015-02-25 NOTE — Therapy (Signed)
[facility] [address] [place], [place], [address] Phone: [phone]   Fax:  [phone]  Physical Therapy Treatment  Patient Details  Name: Jacob Tran MRN: [id number] Date of Birth: 05-29-1933 Referring Provider: Kerin Salen  Encounter Date: 02/25/2015      PT End of Session - 02/25/15 1542    Visit Number 2   Number of Visits 9   Date for PT Re-Evaluation 04/20/15   Authorization Type Medicare/BCBS-GCODE every 10th visit   PT Start Time 1240   PT Stop Time 1325   PT Time Calculation (min) 45 min   Equipment Utilized During Treatment Gait belt   Activity Tolerance Patient tolerated treatment well   Behavior During Therapy [facility] for tasks assessed/performed      Past Medical History  Diagnosis Date  . Parkinson's disease (HCC)   . Enlarged prostate   . Hyperlipidemia   . Hypertension     Past Surgical History  Procedure Laterality Date  . Tonsillectomy      Filed Vitals:   02/25/15 1505 02/25/15 1508  Pulse: 61 88    Visit Diagnosis:  Decreased coordination  Decreased functional mobility      Subjective Assessment - 02/25/15 1508    Subjective Doesn't remember using the bike at the previous exercise facility.   Patient is accompained by: Family member   Currently in Pain? No/denies   Multiple Pain Sites No                         [facility] Adult PT Treatment/Exercise - 02/25/15 1511    Ambulation/Gait   Ambulation/Gait Yes   Ambulation/Gait Assistance 5: Supervision   Ambulation Distance (Feet) 250 Feet   Assistive device None   Gait Pattern Step-through pattern;Decreased arm swing - right;Decreased arm swing - left;Decreased step length - right;Decreased step length - left;Lateral hip instability;Decreased trunk rotation;Lateral trunk lean to right;Trunk flexed;Poor foot clearance - left;Poor foot clearance - right   Ambulation Surface Level   Gait Comments Pt has increased  forward flexed posture and increased shuffling type gait pattern as gait progressed in 2 minutes of walking.   Exercises   Exercises Knee/Hip;Ankle;Other Exercises   Knee/Hip Exercises: Aerobic   Other Aerobic sci fit continuous   Knee/Hip Exercises: Standing   Heel Raises --   Knee Flexion --   Hip Abduction --   Functional Squat --   Ankle Exercises: Standing   Toe Raise 10 reps             Balance Exercises - 02/25/15 1532    OTAGO PROGRAM   Hip ABductor 10 reps   Ankle Plantorflexors 20 reps, support   Ankle Dorsiflexors 20 reps, support   Knee Bends 10 reps, support           PT Education - 02/25/15 1537    Education provided Yes   Education Details initiated HEP- part of ORTAGA program   Person(s) Educated Spouse   Methods Explanation;Demonstration;Handout   Comprehension Verbalized understanding;Returned demonstration             PT Long Term Goals - 02/20/15 1546    PT LONG TERM GOAL #1   Title Pt will perform HEP with family's assistance for improved posture, balance, gait.  TARGET 03/21/15   Time 4   Period Weeks   Status New   PT LONG TERM GOAL #2   Title Pt will improve Timed Up and Go test to  less than or equal to 13.5 seconds for decreased fall risk.   Time 4   Period Weeks   Status New   PT LONG TERM GOAL #3   Title Pt will improve Dynamic Gait Index to at least 15/24 for decreased fall risk.   Time 4   Period Weeks   Status New   PT LONG TERM GOAL #4   Title Pt will tolerate at least 5 minutes of walking activities without loss of balance due to increased forward flexed posture and shuffling gait.   Time 4   Period Weeks   Status New   PT LONG TERM GOAL #5   Title Pt/wife will verbalize understanidng of fall prevention within the home environment.   Time 4   Period Weeks   Status New   Additional Long Term Goals   Additional Long Term Goals Yes   PT LONG TERM GOAL #6   Title Pt/wife will verbalize understanding of local  Parkinson's disease related resources, including community fitness options.   Time 4   Period Weeks   Status New   PT LONG TERM GOAL #7   Title Pt will perform at least 8 of 10 reps of sit<>stand transfers with minimal to no UE support from 18 inch surfaces and lower, for improved efficiency and safety with transfers.   Time 4   Period Weeks   Status New               Plan - 02/25/15 1546    Clinical Impression Statement skilled session focused on initiating HEP and increasing activity tolerance to exercise to progress mobility and strength deficits.   Pt will benefit from skilled therapeutic intervention in order to improve on the following deficits Abnormal gait;Decreased activity tolerance;Decreased balance;Decreased mobility;Decreased endurance;Decreased safety awareness;Decreased strength;Difficulty walking;Postural dysfunction   Rehab Potential Good   PT Frequency 2x / week   PT Duration 4 weeks   PT Treatment/Interventions ADLs/Self Care Home Management;Therapeutic exercise;Therapeutic activities;Functional mobility training;Gait training;Balance training;Neuromuscular re-education;Patient/family education   PT Next Visit Plan Review HEP and  focus treatment on posture stretching and strengthening, balance activities to improve step length and weightshifting   Consulted and Agree with Plan of Care Patient;Family member/caregiver   Family Member Consulted wife        Problem List Patient Active Problem List   Diagnosis Date Noted  . Parkinson's disease dementia (HCC) 09/17/2014  . BPH (benign prostatic hyperplasia) 09/17/2014  . Parkinson's disease (HCC) 09/17/2014   Hortencia Conradi, PTA  02/25/2015, 4:00 PM  [place] [address] [place], [place], [address] Phone: [phone]   Fax:  [phone]  Name: Jacob Tran MRN: [id number] Date of Birth: [birth year]

## 2015-02-27 ENCOUNTER — Ambulatory Visit: Payer: Medicare Other

## 2015-02-27 ENCOUNTER — Ambulatory Visit: Payer: Medicare Other | Admitting: Physical Therapy

## 2015-02-27 DIAGNOSIS — R4189 Other symptoms and signs involving cognitive functions and awareness: Secondary | ICD-10-CM | POA: Diagnosis not present

## 2015-02-27 DIAGNOSIS — R293 Abnormal posture: Secondary | ICD-10-CM

## 2015-02-27 DIAGNOSIS — R29898 Other symptoms and signs involving the musculoskeletal system: Secondary | ICD-10-CM | POA: Diagnosis not present

## 2015-02-27 DIAGNOSIS — R2689 Other abnormalities of gait and mobility: Secondary | ICD-10-CM | POA: Diagnosis not present

## 2015-02-27 DIAGNOSIS — R41841 Cognitive communication deficit: Secondary | ICD-10-CM

## 2015-02-27 DIAGNOSIS — R471 Dysarthria and anarthria: Secondary | ICD-10-CM | POA: Diagnosis not present

## 2015-02-27 DIAGNOSIS — R258 Other abnormal involuntary movements: Secondary | ICD-10-CM | POA: Diagnosis not present

## 2015-02-27 DIAGNOSIS — R269 Unspecified abnormalities of gait and mobility: Secondary | ICD-10-CM

## 2015-02-27 DIAGNOSIS — R279 Unspecified lack of coordination: Secondary | ICD-10-CM | POA: Diagnosis not present

## 2015-02-27 NOTE — Therapy (Signed)
[facility] [address] [place], [place], [address] Phone: [phone]   Fax:  [phone]  Physical Therapy Treatment  Patient Details  Name: Jacob Tran MRN: [id number] Date of Birth: Aug 12, 1933 Referring Provider: Kerin Salen  Encounter Date: 02/27/2015      PT End of Session - 02/27/15 1551    Visit Number 3   Number of Visits 9   Date for PT Re-Evaluation 04/20/15   Authorization Type Medicare/BCBS-GCODE every 10th visit   PT Start Time 1318   PT Stop Time 1402   PT Time Calculation (min) 44 min   Activity Tolerance Patient tolerated treatment well   Behavior During Therapy [facility] for tasks assessed/performed      Past Medical History  Diagnosis Date  . Parkinson's disease (HCC)   . Enlarged prostate   . Hyperlipidemia   . Hypertension     Past Surgical History  Procedure Laterality Date  . Tonsillectomy      There were no vitals filed for this visit.  Visit Diagnosis:  Posture abnormality  Bradykinesia  Abnormality of gait  Postural instability      Subjective Assessment - 02/27/15 1321    Subjective Nothing new to report.     Patient is accompained by: Family member   Patient Stated Goals Pt's goal is to help with gait-wife wants to help with shuffling gait, to stop sliding feet along.   Currently in Pain? No/denies                         [facility] Adult PT Treatment/Exercise - 02/27/15 0001    Ambulation/Gait   Ambulation/Gait Yes   Ambulation/Gait Assistance 5: Supervision   Ambulation Distance (Feet) 120 Feet  x 2, then 100 ft   Assistive device None   Gait Pattern Step-through pattern;Decreased arm swing - right;Decreased arm swing - left;Decreased step length - right;Decreased step length - left;Lateral hip instability;Decreased trunk rotation;Lateral trunk lean to right;Trunk flexed;Poor foot clearance - left;Poor foot clearance - right  Pt holds hands clasped  behind back   Ambulation Surface Level;Indoor   Gait Comments Pt's initial steps upon initiating gait are small, shuffling, festinating steps.  Pt requires cues to stop, reset posture and start gait again with larger step pattern.     Exercises   Exercises Knee/Hip;Ankle;Other Exercises   Knee/Hip Exercises: Aerobic   Other Aerobic Sci Fit Level 1.5>1.8, 8 minutes with cues to keep RPM >70-80 for improved intensity of movement  O2 sats 93%>95%, HR 84 bpm after exercise             Balance Exercises - 02/27/15 1321    Balance Exercises: Standing   Other Standing Exercises Lateral weightshifting at counter with UE support x 10 reps>progressing to rocking and reaching at counter x 5 reps.  Standing at doorframe-postural stretch for improved upright posture, x 5 reps 5-10 second hold.   OTAGO PROGRAM   Knee Flexor 10 reps   Hip ABductor 10 reps   Ankle Plantorflexors 20 reps, support   Ankle Dorsiflexors 20 reps, support   Knee Bends 10 reps, support   Backwards Walking Support  Forwards/backwards 3 reps at counter with support   Sideways Walking Assistive device   Overall OTAGO Comments The above exercises have been provided as part of HEP   Balance Exercises: Seated   Other Seated Exercises Seated posture exercises-PWR! Up x 10 reps with cues for scapular squeeze and breathing sequence  PT Education - 02/27/15 1352    Education provided Yes   Education Details HEP-additions-see instructions   Person(s) Educated Patient;Spouse   Methods Explanation;Demonstration;Handout   Comprehension Verbalized understanding;Returned demonstration             PT Long Term Goals - 02/20/15 1546    PT LONG TERM GOAL #1   Title Pt will perform HEP with family's assistance for improved posture, balance, gait.  TARGET 03/21/15   Time 4   Period Weeks   Status New   PT LONG TERM GOAL #2   Title Pt will improve Timed Up and Go test to less than or equal to 13.5 seconds for  decreased fall risk.   Time 4   Period Weeks   Status New   PT LONG TERM GOAL #3   Title Pt will improve Dynamic Gait Index to at least 15/24 for decreased fall risk.   Time 4   Period Weeks   Status New   PT LONG TERM GOAL #4   Title Pt will tolerate at least 5 minutes of walking activities without loss of balance due to increased forward flexed posture and shuffling gait.   Time 4   Period Weeks   Status New   PT LONG TERM GOAL #5   Title Pt/wife will verbalize understanidng of fall prevention within the home environment.   Time 4   Period Weeks   Status New   Additional Long Term Goals   Additional Long Term Goals Yes   PT LONG TERM GOAL #6   Title Pt/wife will verbalize understanding of local Parkinson's disease related resources, including community fitness options.   Time 4   Period Weeks   Status New   PT LONG TERM GOAL #7   Title Pt will perform at least 8 of 10 reps of sit<>stand transfers with minimal to no UE support from 18 inch surfaces and lower, for improved efficiency and safety with transfers.   Time 4   Period Weeks   Status New               Plan - 02/27/15 1552    Clinical Impression Statement Pt appears to have been performing exercises with wife's assistance at home, as part of HEP from first PT visit.  During this session, pt requires frequent cues to reset upright posture during standing activities.  Pt tolerates postural stretch well at doorframe and discussed how this can be incorporated more frequently through the day at home for improved postural strengthening.  Pt will continue to benefit from further skilled PT to address posture, balance, gait deficits.   Pt will benefit from skilled therapeutic intervention in order to improve on the following deficits Abnormal gait;Decreased activity tolerance;Decreased balance;Decreased mobility;Decreased endurance;Decreased safety awareness;Decreased strength;Difficulty walking;Postural dysfunction    Rehab Potential Good   PT Frequency 2x / week   PT Duration 4 weeks   PT Treatment/Interventions ADLs/Self Care Home Management;Therapeutic exercise;Therapeutic activities;Functional mobility training;Gait training;Balance training;Neuromuscular re-education;Patient/family education   PT Next Visit Plan Review additions to HEP; discuss techniques to decreased freezing with gait/turns and practice initiating gait with weightshifting   Consulted and Agree with Plan of Care Patient;Family member/caregiver   Family Member Consulted wife        Problem List Patient Active Problem List   Diagnosis Date Noted  . Parkinson's disease dementia (HCC) 09/17/2014  . BPH (benign prostatic hyperplasia) 09/17/2014  . Parkinson's disease (HCC) 09/17/2014    Dior Stepter W. 02/27/2015, 3:56 PM  Gean Maidens PT [facility] [address] [place], [place], [address] Phone: [phone]   Fax:  [phone]  Name: Jacob Tran MRN: [id number] Date of Birth: 1933-09-13

## 2015-02-27 NOTE — Patient Instructions (Addendum)
Scapular Retraction (Standing)    Stand at the doorframe and try to "stand as tall as the wall".  Try to keep your shoulders up and head up, with arms at sides, pinch shoulder blades together. Repeat _10___ times per set, holding 10-15 seconds in your best tall posture position each time.  Do _3-4___ sessions per day.  http://orth.exer.us/944   Copyright  VHI. All rights reserved.   Also provided OTAGO exercises-sidestepping along counter, forward/back walking along counter.

## 2015-02-28 NOTE — Patient Instructions (Signed)
Complete loud "ah" 5 times, twice a day, 6 days a week

## 2015-02-28 NOTE — Therapy (Signed)
[facility] [address] [place], [place], [address] Phone: [phone]   Fax:  [phone]  Speech Language Pathology Treatment  Patient Details  Name: Jacob Tran MRN: [id number] Date of Birth: 10/22/1933 Referring Provider: Nyoka Lint Tat  Encounter Date: 02/27/2015      End of Session - 02/28/15 1311    Visit Number 2   Number of Visits 17   Date for SLP Re-Evaluation 04/16/15   SLP Start Time 1403   SLP Stop Time  1445   SLP Time Calculation (min) 42 min   Activity Tolerance Patient tolerated treatment well;Patient limited by lethargy      Past Medical History  Diagnosis Date  . Parkinson's disease (HCC)   . Enlarged prostate   . Hyperlipidemia   . Hypertension     Past Surgical History  Procedure Laterality Date  . Tonsillectomy      There were no vitals filed for this visit.  Visit Diagnosis: Dysarthria  Cognitive communication deficit      Subjective Assessment - 02/27/15 1405    Subjective (p) Pt arrives with wife, Jacob Tran.                ADULT SLP TREATMENT - 02/28/15 1241    General Information   Behavior/Cognition Cooperative;Pleasant mood;Confused;Lethargic;Requires cueing  decr'd initiation   Treatment Provided   Treatment provided Cognitive-Linquistic   Pain Assessment   Pain Assessment No/denies pain   Cognitive-Linquistic Treatment   Treatment focused on Dysarthria   Skilled Treatment SLP worked for approx 10 minutes shaping pt's loud /a/ as pt began with volume at average 76dB and approx 3 seconds. With demonstration and verbal cues pt maintained loud /a/ over 5 trials with usual min A for loudness at average 83dB and 7 seconds. In strucutred question and answer tasks pt's loudness increased to 67dB and usual min A for encr of loudness/effort. SLP educated pt/pt's wife on necessity to accomplish loud /a/ at least 6 days/week. Pt with no initiation even with high interest  topic ([employer] fiction).    Assessment / Recommendations / Plan   Plan Continue with current plan of care   Progression Toward Goals   Progression toward goals Progressing toward goals          SLP Education - 02/28/15 1311    Education provided Yes   Education Details need to do loud /a/ 6 days a week, 5 reps, twice a day, loud /a/ shaping   Person(s) Educated Patient;Spouse   Methods Explanation;Demonstration;Verbal cues   Comprehension Verbalized understanding;Returned demonstration;Verbal cues required;Need further instruction          SLP Short Term Goals - 02/28/15 1313    SLP SHORT TERM GOAL #1   Title Pt. will demonstrate loud /a/ average of 86dB over 3 sessions.   Time 4   Period Weeks   Status On-going   SLP SHORT TERM GOAL #2   Title Pt will demonstrate average of 70 dB 85% of structured speech tasks with rare min A   Time 4   Period Weeks   Status On-going   SLP SHORT TERM GOAL #3   Title Pt will respond with 3-4 word utterances during structured speech tasks with occasional min A   Time 4   Period Weeks   Status On-going   SLP SHORT TERM GOAL #4   Title Pt will initiate social greeting in the clinic 1x over 3 sessions with rare min A.   Time 4  Period Weeks   Status On-going          SLP Long Term Goals - 02/28/15 1314    SLP LONG TERM GOAL #1   Title Pt will average 70dB during simple conversation over 5 minutes with occasional min A   Time 8   Period Weeks   Status On-going   SLP LONG TERM GOAL #2   Title Pt will request help or tool needed (ie pen, paper, calculator etc) during structured task with rare min A.   Time 8   Period Weeks   Status On-going   SLP LONG TERM GOAL #3   Title Pt will use 3-4 word utterances in simple conversation over 5 minutes with occasional min cues.    Time 8   Period Weeks   Status On-going          Plan - 02/28/15 1312    Clinical Impression Statement Pt requires usual cues for loudness in structured  tasks. His decr'd cognitive-linguistic skills hinder his overall progress today. He requires skilled ST to cont to improve his loudness in conversation/responding to a speaker, and to provide wife with compnsations to ease caregiver burden re: communcication.   Speech Therapy Frequency 2x / week   Duration --  8 weeks   Treatment/Interventions Cognitive reorganization;Internal/external aids;Compensatory strategies;SLP instruction and feedback;Patient/family education;Functional tasks;Multimodal communcation approach   Potential to Achieve Goals Fair   Potential Considerations Cooperation/participation level;Medical prognosis   Consulted and Agree with Plan of Care Patient;Family member/caregiver   Family Member Consulted spouse, Jacob Tran        Problem List Patient Active Problem List   Diagnosis Date Noted  . Parkinson's disease dementia (HCC) 09/17/2014  . BPH (benign prostatic hyperplasia) 09/17/2014  . Parkinson's disease (HCC) 09/17/2014    [facility] ,MS, CCC-SLP  02/28/2015, 1:14 PM  [facility] [address] [place], [place], [address] Phone: [phone]   Fax:  [phone]   Name: Jacob Tran MRN: [id number] Date of Birth: November 18, 1933

## 2015-03-05 ENCOUNTER — Ambulatory Visit: Payer: Medicare Other

## 2015-03-05 ENCOUNTER — Ambulatory Visit: Payer: Medicare Other | Admitting: Rehabilitative and Restorative Service Providers"

## 2015-03-05 ENCOUNTER — Ambulatory Visit: Payer: Medicare Other | Admitting: Occupational Therapy

## 2015-03-05 DIAGNOSIS — R279 Unspecified lack of coordination: Secondary | ICD-10-CM | POA: Diagnosis not present

## 2015-03-05 DIAGNOSIS — R293 Abnormal posture: Secondary | ICD-10-CM

## 2015-03-05 DIAGNOSIS — R29898 Other symptoms and signs involving the musculoskeletal system: Secondary | ICD-10-CM

## 2015-03-05 DIAGNOSIS — R4189 Other symptoms and signs involving cognitive functions and awareness: Secondary | ICD-10-CM | POA: Diagnosis not present

## 2015-03-05 DIAGNOSIS — R269 Unspecified abnormalities of gait and mobility: Secondary | ICD-10-CM

## 2015-03-05 DIAGNOSIS — R258 Other abnormal involuntary movements: Secondary | ICD-10-CM

## 2015-03-05 DIAGNOSIS — R41841 Cognitive communication deficit: Secondary | ICD-10-CM

## 2015-03-05 DIAGNOSIS — R471 Dysarthria and anarthria: Secondary | ICD-10-CM

## 2015-03-05 DIAGNOSIS — R2689 Other abnormalities of gait and mobility: Secondary | ICD-10-CM | POA: Diagnosis not present

## 2015-03-05 DIAGNOSIS — R278 Other lack of coordination: Secondary | ICD-10-CM

## 2015-03-05 NOTE — Therapy (Signed)
[facility] [address] [place], [place], [address] Phone: [phone]   Fax:  [phone]  Physical Therapy Treatment  Patient Details  Name: Jacob Tran MRN: [id number] Date of Birth: 1933-10-14 Referring Provider: Kerin Salen  Encounter Date: 03/05/2015      PT End of Session - 03/05/15 2031    Visit Number 4   Number of Visits 9   Date for PT Re-Evaluation 04/20/15   Authorization Type Medicare/BCBS-GCODE every 10th visit   PT Start Time 1150   PT Stop Time 1230   PT Time Calculation (min) 40 min   Equipment Utilized During Treatment Gait belt   Activity Tolerance Patient tolerated treatment well   Behavior During Therapy [facility] for tasks assessed/performed      Past Medical History  Diagnosis Date  . Parkinson's disease (HCC)   . Enlarged prostate   . Hyperlipidemia   . Hypertension     Past Surgical History  Procedure Laterality Date  . Tonsillectomy      There were no vitals filed for this visit.  Visit Diagnosis:  Abnormality of gait  Postural instability  Decreased functional mobility  Posture abnormality      Subjective Assessment - 03/05/15 2030    Subjective Patient's wife reports he is not performing HEP regularly.     Patient Stated Goals Pt's goal is to help with gait-wife wants to help with shuffling gait, to stop sliding feet along.   Currently in Pain? No/denies      NEUROMUSCULAR RE-EDUCATION: Seated physioball rolling to initiate anterior pelvic tilt for carryover to sit>stand transfers Standing PWR! Up training with cues on posture PWR! Sit<>stand x 5 reps x 2 sets decreasing UE support Standing wall bumps for ant/posterior hip control x 10 reps Standing wide base rocking laterally to lay foundation for turns  Gait: Ambulation without device x 230 feet x 4 reps with cues on stride length and posture (needs tactile reminders for posture) with SBA to CGA for safety.   Patient fatigues and has shortness of breath with ambulation and requires occasional rest breaks during gait activities.  THERAPEUTIC EXERCISE: Seated hamstring stretching (with increased lumbar stretch visible) Supine P/ROM hamstring stretch x 20 seconds x 2 reps each leg Supine lumbar anterior tilt stretch placing towel roll perpendicular to spine and lengthening each leg individually.         PT Long Term Goals - 02/20/15 1546    PT LONG TERM GOAL #1   Title Pt will perform HEP with family's assistance for improved posture, balance, gait.  TARGET 03/21/15   Time 4   Period Weeks   Status New   PT LONG TERM GOAL #2   Title Pt will improve Timed Up and Go test to less than or equal to 13.5 seconds for decreased fall risk.   Time 4   Period Weeks   Status New   PT LONG TERM GOAL #3   Title Pt will improve Dynamic Gait Index to at least 15/24 for decreased fall risk.   Time 4   Period Weeks   Status New   PT LONG TERM GOAL #4   Title Pt will tolerate at least 5 minutes of walking activities without loss of balance due to increased forward flexed posture and shuffling gait.   Time 4   Period Weeks   Status New   PT LONG TERM GOAL #5   Title Pt/wife will verbalize understanidng of fall prevention within the home environment.  Time 4   Period Weeks   Status New   Additional Long Term Goals   Additional Long Term Goals Yes   PT LONG TERM GOAL #6   Title Pt/wife will verbalize understanding of local Parkinson's disease related resources, including community fitness options.   Time 4   Period Weeks   Status New   PT LONG TERM GOAL #7   Title Pt will perform at least 8 of 10 reps of sit<>stand transfers with minimal to no UE support from 18 inch surfaces and lower, for improved efficiency and safety with transfers.   Time 4   Period Weeks   Status New               Plan - 03/05/15 2032    Clinical Impression Statement The patient is limited in postural upright by  significant tightness in hamstrings contributing further to posterior pelvic tilt and shoulder rounding.  Patient improves with posture within session after stretching hamstrings, stretching lumbar spine, and demonstrating PWR! up posture.  Discussed ways to incorporate HEP into daily activities ("commercial break exercise" during gameshows, exercising at same time everyday).   Continue working towards [employer].   PT Next Visit Plan Review additions to HEP; discuss techniques to decreased freezing with gait/turns and practice initiating gait with weightshifting   Consulted and Agree with Plan of Care Patient;Family member/caregiver   Family Member Consulted wife        Problem List Patient Active Problem List   Diagnosis Date Noted  . Parkinson's disease dementia (HCC) 09/17/2014  . BPH (benign prostatic hyperplasia) 09/17/2014  . Parkinson's disease (HCC) 09/17/2014    Maor Meckel, PT 03/05/2015, 8:35 PM   [facility] [address] [place], [place], [address] Phone: [phone]   Fax:  [phone]  Name: Jacob Tran MRN: [id number] Date of Birth: [birth year]

## 2015-03-05 NOTE — Therapy (Signed)
[facility] [address] [place], [place], [address] Phone: [phone]   Fax:  [phone]  Speech Language Pathology Treatment  Patient Details  Name: Jacob Tran MRN: [id number] Date of Birth: August 07, 1933 Referring Provider: Nyoka Lint Tran  Encounter Date: 03/05/2015      End of Session - 03/05/15 1404    Visit Number 3   Number of Visits 17   Date for SLP Re-Evaluation 04/16/15   SLP Start Time 1317   SLP Stop Time  1401   SLP Time Calculation (min) 44 min   Activity Tolerance Patient tolerated treatment well      Past Medical History  Diagnosis Date  . Parkinson's disease (HCC)   . Enlarged prostate   . Hyperlipidemia   . Hypertension     Past Surgical History  Procedure Laterality Date  . Tonsillectomy      There were no vitals filed for this visit.  Visit Diagnosis: Dysarthria  Cognitive communication deficit      Subjective Assessment - 03/05/15 1324    Subjective Pt remarked his wife has been reading Jacob Tran out loud instead of him - wife denied.   Patient is accompained by: Family member  wife               ADULT SLP TREATMENT - 03/05/15 1325    General Information   Behavior/Cognition Cooperative;Pleasant mood;Confused;Requires cueing   Treatment Provided   Treatment provided Cognitive-Linquistic   Cognitive-Linquistic Treatment   Treatment focused on Dysarthria   Skilled Treatment SLP targeted loud /a/ today with mod shaping necessary. Average 80dB with usual min mod A for loudness and full breath.  Expressive ID objects with average 72dB, simple picture description with occasional min-mod A for loudness. Pt read paragraphs with average 68dB and rare min A for loudness. SLP told pt/wife that loud /a/ is the foundation for therapy - doing this must be done.    Assessment / Recommendations / Plan   Plan Continue with current plan of care   Progression Toward Goals   Progression  toward goals Progressing toward goals          SLP Education - 03/05/15 1404    Education provided Yes   Education Details need to do loud /a/ every day (at least 6 days a week)   Person(s) Educated Patient;Spouse   Methods Explanation;Demonstration;Verbal cues   Comprehension Returned demonstration;Verbal cues required;Verbalized understanding          SLP Short Term Goals - 03/05/15 1405    SLP SHORT TERM GOAL #1   Title Pt. will demonstrate loud /a/ average of 86dB over 3 sessions.   Time 3   Period Weeks   Status On-going   SLP SHORT TERM GOAL #2   Title Pt will demonstrate average of 70 dB 85% of structured speech tasks with rare min A   Time 3   Period Weeks   Status On-going   SLP SHORT TERM GOAL #3   Title Pt will respond with 3-4 word utterances during structured speech tasks with occasional min A   Time 3   Period Weeks   Status On-going   SLP SHORT TERM GOAL #4   Title Pt will initiate social greeting in the clinic 1x over 3 sessions with rare min A.   Time 3   Period Weeks   Status On-going          SLP Long Term Goals - 03/05/15 1405    SLP  LONG TERM GOAL #1   Title Pt will average 70dB during simple conversation over 5 minutes with occasional min A   Time 7   Period Weeks   Status On-going   SLP LONG TERM GOAL #2   Title Pt will request help or tool needed (ie pen, paper, calculator etc) during structured task with rare min A.   Time 7   Period Weeks   Status On-going   SLP LONG TERM GOAL #3   Title Pt will use 3-4 word utterances in simple conversation over 5 minutes with occasional min cues.    Time 7   Period Weeks   Status On-going          Plan - 03/05/15 1404    Clinical Impression Statement Pt requires usual cues for loudness in structured tasks. His decr'd cognitive-linguistic skills hinder his overall progress. He requires skilled ST to cont to improve his loudness in conversation/responding to a speaker, and to provide wife with  compnsations to ease caregiver burden re: communcication.   Speech Therapy Frequency 2x / week   Duration --  8 weeks   Treatment/Interventions Cognitive reorganization;Internal/external aids;Compensatory strategies;SLP instruction and feedback;Patient/family education;Functional tasks;Multimodal communcation approach   Potential to Achieve Goals Fair   Potential Considerations Cooperation/participation level;Medical prognosis   Consulted and Agree with Plan of Care Patient;Family member/caregiver   Family Member Consulted spouse, Jacob Tran        Problem List Patient Active Problem List   Diagnosis Date Noted  . Parkinson's disease dementia (HCC) 09/17/2014  . BPH (benign prostatic hyperplasia) 09/17/2014  . Parkinson's disease (HCC) 09/17/2014    [facility] ,MS, CCC-SLP  03/05/2015, 2:05 PM  [facility] [address] [place], [place], [address] Phone: [phone]   Fax:  [phone]   Name: Jacob Tran MRN: [id number] Date of Birth: [birth year]

## 2015-03-05 NOTE — Patient Instructions (Signed)
Read out loud to [place]. Practice your loud "ah" every day 5 times, twice a day

## 2015-03-06 ENCOUNTER — Ambulatory Visit: Payer: Medicare Other | Admitting: Physical Therapy

## 2015-03-06 ENCOUNTER — Ambulatory Visit: Payer: Medicare Other

## 2015-03-06 DIAGNOSIS — R4189 Other symptoms and signs involving cognitive functions and awareness: Secondary | ICD-10-CM | POA: Diagnosis not present

## 2015-03-06 DIAGNOSIS — R279 Unspecified lack of coordination: Secondary | ICD-10-CM | POA: Diagnosis not present

## 2015-03-06 DIAGNOSIS — R41841 Cognitive communication deficit: Secondary | ICD-10-CM

## 2015-03-06 DIAGNOSIS — R471 Dysarthria and anarthria: Secondary | ICD-10-CM | POA: Diagnosis not present

## 2015-03-06 DIAGNOSIS — R258 Other abnormal involuntary movements: Secondary | ICD-10-CM | POA: Diagnosis not present

## 2015-03-06 DIAGNOSIS — R29898 Other symptoms and signs involving the musculoskeletal system: Secondary | ICD-10-CM | POA: Diagnosis not present

## 2015-03-06 DIAGNOSIS — R2689 Other abnormalities of gait and mobility: Secondary | ICD-10-CM | POA: Diagnosis not present

## 2015-03-06 DIAGNOSIS — R293 Abnormal posture: Secondary | ICD-10-CM

## 2015-03-06 NOTE — Therapy (Signed)
[facility] [address] [place], [place], [address] Phone: [phone]   Fax:  [phone]  Speech Language Pathology Treatment  Patient Details  Name: Jacob Tran MRN: [id number] Date of Birth: 07-10-1933 Referring Provider: Nyoka Lint Tat  Encounter Date: 03/06/2015      End of Session - 03/06/15 1637    Visit Number 4   Number of Visits 17   Date for SLP Re-Evaluation 04/16/15   SLP Start Time 1447   SLP Stop Time  1530   SLP Time Calculation (min) 43 min   Activity Tolerance Patient tolerated treatment well      Past Medical History  Diagnosis Date  . Parkinson's disease (HCC)   . Enlarged prostate   . Hyperlipidemia   . Hypertension     Past Surgical History  Procedure Laterality Date  . Tonsillectomy      There were no vitals filed for this visit.  Visit Diagnosis: Dysarthria  Cognitive communication deficit      Subjective Assessment - 03/06/15 1453    Subjective Pt did not recall what he had for breakfast this morning.   Patient is accompained by: Family member  wife               ADULT SLP TREATMENT - 03/06/15 1457    General Information   Behavior/Cognition Cooperative;Pleasant mood;Confused;Requires cueing   Treatment Provided   Treatment provided Cognitive-Linquistic   Cognitive-Linquistic Treatment   Treatment focused on Dysarthria   Skilled Treatment Pt began with loud /a/ to objectify pt's loudness in conversation to improve speech loudness in conversation. He then read paragraphs of 4-5 sentences and maintained louder speech 85% of the time. In similarity/difference task, pt maintained volume >70dB 80% of the time. In response to SLP questions outside of formal structured therapy tasks, volume decr'd to average 67dB.   Assessment / Recommendations / Plan   Plan Continue with current plan of care   Progression Toward Goals   Progression toward goals Progressing toward  goals          SLP Education - 03/05/15 1404    Education provided Yes   Education Details need to do loud /a/ every day (at least 6 days a week)   Person(s) Educated Patient;Spouse   Methods Explanation;Demonstration;Verbal cues   Comprehension Returned demonstration;Verbal cues required;Verbalized understanding          SLP Short Term Goals - 03/06/15 1639    SLP SHORT TERM GOAL #1   Title Pt. will demonstrate loud /a/ average of 86dB over 3 sessions.   Time 3   Period Weeks   Status On-going   SLP SHORT TERM GOAL #2   Title Pt will demonstrate average of 70 dB 85% of structured speech tasks with rare min A in three sessions   Time 3   Period Weeks   Status Revised   SLP SHORT TERM GOAL #3   Title Pt will respond with 3-4 word utterances during structured speech tasks with occasional min A in three sessions   Time 3   Period Weeks   Status Revised   SLP SHORT TERM GOAL #4   Title Pt will initiate social greeting in the clinic 1x over 3 sessions with rare min A.   Time 3   Period Weeks   Status On-going          SLP Long Term Goals - 03/06/15 1640    SLP LONG TERM GOAL #1   Title Pt  will average 70dB during simple conversation over 5 minutes with occasional min A   Time 7   Period Weeks   Status On-going   SLP LONG TERM GOAL #2   Title Pt will request help or tool needed (ie pen, paper, calculator etc) during structured task with rare min A.   Time 7   Period Weeks   Status On-going   SLP LONG TERM GOAL #3   Title Pt will use 3-4 word utterances in simple conversation over 5 minutes with occasional min cues over three sessions   Time 7   Period Weeks   Status Revised          Plan - 03/06/15 1637    Clinical Impression Statement Pt requires min cues for loudness rarely in structured tasks today. His decr'd cognitive-linguistic skills hinder his overall progress for carryover as wife states pt's loud speech has not transferred to home environment. He  requires skilled ST to cont to improve his loudness in conversation/responding to a speaker, and to provide wife with compnsations to ease caregiver burden re: communcication.   Speech Therapy Frequency 2x / week   Duration --  7 weeks   Treatment/Interventions Cognitive reorganization;Internal/external aids;Compensatory strategies;SLP instruction and feedback;Patient/family education;Functional tasks;Multimodal communcation approach   Potential to Achieve Goals Fair   Potential Considerations Cooperation/participation level;Medical prognosis;Severity of impairments        Problem List Patient Active Problem List   Diagnosis Date Noted  . Parkinson's disease dementia (HCC) 09/17/2014  . BPH (benign prostatic hyperplasia) 09/17/2014  . Parkinson's disease (HCC) 09/17/2014    [facility] ,MS, CCC-SLP  03/06/2015, 4:41 PM  [facility] [address] [place], [place], [address] Phone: [phone]   Fax:  [phone]   Name: Jacob Tran MRN: [id number] Date of Birth: 1933/12/21

## 2015-03-06 NOTE — Patient Instructions (Signed)
HIP: Hamstrings - Short Sitting    Scoot toward the edge of a chair and sit up tall.  Rest leg on raised surface. Keep knee straight. Lift chest to stay tall. Hold _30__ seconds. _3__ reps each leg, _2-3__ sets per day  Copyright  VHI. All rights reserved.

## 2015-03-06 NOTE — Therapy (Signed)
[facility] [address] [place], [place], [address] Phone: [phone]   Fax:  [phone]  Occupational Therapy Treatment  Patient Details  Name: Jacob Tran MRN: [id number] Date of Birth: May 30, 1933 Referring Provider: Dr. Lurena Joiner Tat  Encounter Date: 03/05/2015      OT End of Session - 03/06/15 1725    Visit Number 2   Number of Visits 17   Date for OT Re-Evaluation 04/19/15   Authorization Type Medicare/BCBS, need G code   Authorization - Visit Number 2   Authorization - Number of Visits 10   OT Start Time 1105   OT Stop Time 1145   OT Time Calculation (min) 40 min   Activity Tolerance Patient tolerated treatment well   Behavior During Therapy [facility] for tasks assessed/performed      Past Medical History  Diagnosis Date  . Parkinson's disease (HCC)   . Enlarged prostate   . Hyperlipidemia   . Hypertension     Past Surgical History  Procedure Laterality Date  . Tonsillectomy      There were no vitals filed for this visit.  Visit Diagnosis:  Bradykinesia  Abnormality of gait  Postural instability  Decreased coordination  Rigidity  Cognitive deficits      Subjective Assessment - 03/06/15 1724    Patient is accompained by: Family member   Pertinent History PD (diagnosed since 2010); Parkinson's disease dementia, hx of "back problems"   Limitations PD dementia   Patient Stated Goals improve speech, walking, and decr time for ADLs          Pt / wife were educated regarding PWR! basic 4 seated and supine 10 reps each, mod v.c./ demonstration Therapist  stressed importance of exercise at home and overall benefits for posture and to improve ADL performance                    OT Education - 03/06/15 1726    Education provided Yes   Education Details PWR! basic 4 supine/ seated   Person(s) Educated Patient;Spouse   Methods Explanation;Demonstration;Verbal cues;Handout   Comprehension Verbalized understanding;Returned demonstration;Verbal cues required;Need further instruction          OT Short Term Goals - 02/19/15 2053    OT SHORT TERM GOAL #1   Title Pt/wife will be independent with PD-specific HEP.--check STGs 03/20/15   Time 4   Period Weeks   Status New   OT SHORT TERM GOAL #2   Title Pt/wife will verbalize understanding of ways to prevent future complications and appropriate community resources.   Time 4   Period Weeks   Status New   OT SHORT TERM GOAL #3   Title Pt will improve coordination for ADLs as shown by improving time on 9-hole peg test by at least 4sec bilaterally.   Baseline R-50.75sec, L-45.28sec   Time 4   Period Weeks   Status New           OT Long Term Goals - 02/19/15 2056    OT LONG TERM GOAL #1   Title Pt/wife will verbalize understanding of AE/strategies to incr ease with ADLs prn.--check LTGs 04/19/15   Time 8   Period Weeks   Status New   OT LONG TERM GOAL #2   Title Pt will improve functional reaching for ADLs as shown by improving score on box and blocks test by at least 5 with LUE.   Baseline 30 blocks   Period Weeks   Status  New   OT LONG TERM GOAL #3   Title Pt will improve coordination for ADLs as shown by improving time on 9-hole peg test by at least 8 sec with RUE.   Baseline 50.75sec   Period Weeks   Status New   OT LONG TERM GOAL #4   Title Pt will be able to fasten/unfasten 3 buttons on table by at least 4sec.   Baseline 42.0sec   Time 8   Period Weeks   Status New   OT LONG TERM GOAL #5   Title Pt will demo decr bradykinesia as shown by demo full R elbow extension with overhead reaching at least 90% of the time.   Time 8   Period Weeks   Status New               Plan - 03/06/15 1725    Clinical Impression Statement Pt is progressing towards goals. He requires cueing for HEP. Pt's wife is able to assist.   Pt will benefit from skilled therapeutic intervention in order to improve  on the following deficits (Retired) Decreased cognition;Decreased mobility;Decreased strength;Impaired UE functional use;Decreased knowledge of use of DME;Decreased balance;Decreased activity tolerance;Impaired tone;Decreased coordination;Decreased range of motion   Rehab Potential Good   Clinical Impairments Affecting Rehab Potential PD dementia/cognitive deficits, decr initiation   OT Frequency 2x / week   OT Duration 8 weeks   OT Treatment/Interventions Self-care/ADL training;Therapeutic exercise;Functional Mobility Training;Patient/family education;Balance training;Splinting;Manual Therapy;Neuromuscular education;Ultrasound;Energy conservation;Therapeutic exercises;Therapeutic activities;DME and/or AE instruction;Cryotherapy;Electrical Stimulation;Fluidtherapy;Cognitive remediation/compensation;Visual/perceptual remediation/compensation;Passive range of motion;Contrast Bath;Moist Heat   Plan PWR! hands   Consulted and Agree with Plan of Care Patient        Problem List Patient Active Problem List   Diagnosis Date Noted  . Parkinson's disease dementia (HCC) 09/17/2014  . BPH (benign prostatic hyperplasia) 09/17/2014  . Parkinson's disease (HCC) 09/17/2014    RINE,KATHRYN 03/06/2015, 5:27 PM Keene Breath, OTR/L [phone] Phone: [phone] 5:27 PM 02/15/2017[facility] [address] [place], [place], [address] Phone: [phone]   Fax:  [phone]  Name: Jacob Tran MRN: [id number] Date of Birth: 1933/08/25

## 2015-03-07 ENCOUNTER — Other Ambulatory Visit: Payer: Self-pay | Admitting: Physician Assistant

## 2015-03-07 NOTE — Therapy (Signed)
[facility] [address] [place], [place], [address] Phone: [phone]   Fax:  [phone]  Physical Therapy Treatment  Patient Details  Name: Jacob Tran MRN: [id number] Date of Birth: [birth year] Referring Provider: Kerin Salen  Encounter Date: 03/06/2015      PT End of Session - 03/07/15 2228    Visit Number 5   Number of Visits 9   Date for PT Re-Evaluation 04/20/15   Authorization Type Medicare/BCBS-GCODE every 10th visit   PT Start Time 1533   PT Stop Time 1600   PT Time Calculation (min) 27 min   Activity Tolerance Patient tolerated treatment well;Other (comment)  Session ended early due to bathroom accident   Behavior During Therapy [facility] for tasks assessed/performed      Past Medical History  Diagnosis Date  . Parkinson's disease (HCC)   . Enlarged prostate   . Hyperlipidemia   . Hypertension     Past Surgical History  Procedure Laterality Date  . Tonsillectomy      There were no vitals filed for this visit.  Visit Diagnosis:  Posture abnormality      Subjective Assessment - 03/06/15 1536    Subjective "Need to do my exercises more"   Currently in Pain? No/denies        Therapeutic Exercise: Seated low back stretches and pelvic mobility with seated anterior/posterior pelvic tilts x 10 reps.  Encouraged best sitting upright sitting posture during rest breaks.  Seated at edge of mat, then at edge of chair with pillows behind back for support, hamstring stretch with foot propped on floor, then on stool, 3 x 30 seconds each leg.  (Added to HEP)  Neuro Re-education: Postural re-education in sitting with PWR! UP posture exercise x 10 reps, with verbal and visual cues of therapist.  PT also provides tactile cues for improved scapular squeeze.  Sit<>stand transfers x 5 reps, then standing PWR! Up x 10 posture exercise, with also focus on tactile and verbal cues for quad activation/glut activation  for upright posture in standing.                          PT Education - 03/07/15 2228    Education provided Yes   Education Details HEP addition-seated hamstring stretch    Person(s) Educated Patient;Spouse   Methods Explanation;Demonstration;Handout   Comprehension Verbalized understanding;Returned demonstration;Verbal cues required    Discussed discontinuing standing wall/doorframe posture exercise due to wife's reports of patient discomfort.         PT Long Term Goals - 02/20/15 1546    PT LONG TERM GOAL #1   Title Pt will perform HEP with family's assistance for improved posture, balance, gait.  TARGET 03/21/15   Time 4   Period Weeks   Status New   PT LONG TERM GOAL #2   Title Pt will improve Timed Up and Go test to less than or equal to 13.5 seconds for decreased fall risk.   Time 4   Period Weeks   Status New   PT LONG TERM GOAL #3   Title Pt will improve Dynamic Gait Index to at least 15/24 for decreased fall risk.   Time 4   Period Weeks   Status New   PT LONG TERM GOAL #4   Title Pt will tolerate at least 5 minutes of walking activities without loss of balance due to increased forward flexed posture and shuffling gait.   Time 4  Period Weeks   Status New   PT LONG TERM GOAL #5   Title Pt/wife will verbalize understanidng of fall prevention within the home environment.   Time 4   Period Weeks   Status New   Additional Long Term Goals   Additional Long Term Goals Yes   PT LONG TERM GOAL #6   Title Pt/wife will verbalize understanding of local Parkinson's disease related resources, including community fitness options.   Time 4   Period Weeks   Status New   PT LONG TERM GOAL #7   Title Pt will perform at least 8 of 10 reps of sit<>stand transfers with minimal to no UE support from 18 inch surfaces and lower, for improved efficiency and safety with transfers.   Time 4   Period Weeks   Status New               Plan - 03/07/15  2230    Clinical Impression Statement Focused today's session on both low back and hamstring stretching as well as activation of quads and gluts for optimal upright standing posture.  Pt will continue to beneift from further skilled PT to address posture, balance, gait.   Pt will benefit from skilled therapeutic intervention in order to improve on the following deficits Abnormal gait;Decreased activity tolerance;Decreased balance;Decreased mobility;Decreased endurance;Decreased safety awareness;Decreased strength;Difficulty walking;Postural dysfunction   Rehab Potential Good   PT Frequency 2x / week   PT Duration 4 weeks   PT Treatment/Interventions ADLs/Self Care Home Management;Therapeutic exercise;Therapeutic activities;Functional mobility training;Gait training;Balance training;Neuromuscular re-education;Patient/family education   PT Next Visit Plan Review additions to HEP; work again on [employer]! UP in standing to add to HEP; discuss techniques to decreased freezing with gait/turns and practice initiating gait with weightshifting   Consulted and Agree with Plan of Care Patient;Family member/caregiver   Family Member Consulted wife        Problem List Patient Active Problem List   Diagnosis Date Noted  . Parkinson's disease dementia (HCC) 09/17/2014  . BPH (benign prostatic hyperplasia) 09/17/2014  . Parkinson's disease (HCC) 09/17/2014    Devika Dragovich W. 03/07/2015, 10:32 PM  Gean Maidens., PT  [facility] [address] [place], [place], [address] Phone: [phone]   Fax:  [phone]  Name: Draeden Kellman MRN: [id number] Date of Birth: May 04, 1933

## 2015-03-11 ENCOUNTER — Ambulatory Visit: Payer: Medicare Other | Admitting: Rehabilitative and Restorative Service Providers"

## 2015-03-11 DIAGNOSIS — R29898 Other symptoms and signs involving the musculoskeletal system: Secondary | ICD-10-CM | POA: Diagnosis not present

## 2015-03-11 DIAGNOSIS — R258 Other abnormal involuntary movements: Secondary | ICD-10-CM | POA: Diagnosis not present

## 2015-03-11 DIAGNOSIS — R269 Unspecified abnormalities of gait and mobility: Secondary | ICD-10-CM

## 2015-03-11 DIAGNOSIS — R279 Unspecified lack of coordination: Secondary | ICD-10-CM | POA: Diagnosis not present

## 2015-03-11 DIAGNOSIS — R2689 Other abnormalities of gait and mobility: Secondary | ICD-10-CM | POA: Diagnosis not present

## 2015-03-11 DIAGNOSIS — R471 Dysarthria and anarthria: Secondary | ICD-10-CM | POA: Diagnosis not present

## 2015-03-11 DIAGNOSIS — R293 Abnormal posture: Secondary | ICD-10-CM

## 2015-03-11 DIAGNOSIS — R278 Other lack of coordination: Secondary | ICD-10-CM

## 2015-03-11 DIAGNOSIS — R4189 Other symptoms and signs involving cognitive functions and awareness: Secondary | ICD-10-CM | POA: Diagnosis not present

## 2015-03-11 NOTE — Therapy (Signed)
[facility] [address] [place], [place], [address] Phone: [phone]   Fax:  [phone]  Physical Therapy Treatment  Patient Details  Name: Jacob Tran MRN: [id number] Date of Birth: 09-14-1933 Referring Provider: Kerin Salen  Encounter Date: 03/11/2015      PT End of Session - 03/11/15 1325    Visit Number 6   Number of Visits 9   Date for PT Re-Evaluation 04/20/15   Authorization Type Medicare/BCBS-GCODE every 10th visit   PT Start Time 1235   PT Stop Time 1320   PT Time Calculation (min) 45 min   Equipment Utilized During Treatment Gait belt   Activity Tolerance Patient tolerated treatment well;Other (comment)  Session ended early due to bathroom accident   Behavior During Therapy [facility] for tasks assessed/performed      Past Medical History  Diagnosis Date  . Parkinson's disease (HCC)   . Enlarged prostate   . Hyperlipidemia   . Hypertension     Past Surgical History  Procedure Laterality Date  . Tonsillectomy      There were no vitals filed for this visit.  Visit Diagnosis:  Abnormality of gait  Postural instability  Posture abnormality  Bradykinesia  Decreased coordination      Subjective Assessment - 03/11/15 1239    Subjective Patient is doing his exercises more often.   Patient Stated Goals Pt's goal is to help with gait-wife wants to help with shuffling gait, to stop sliding feet along.   Currently in Pain? No/denies      NEUROMUSCULAR RE-EDUCATION: Seated PWR! Up posture with arms flexed to 90 degrees extending through finger tips Sit<>stand emphasizing power up with demonstration on upright posture x 10 reps with verbal and tactile cues Standing wall bumps with cues on hip strategy Standing hip strategy with visual cues during anterior weight shifting Standing lateral weight shifting reaching for objects  Stepping R and L with min A  Gait: Ambulation x 230 feet with  postural cues on upright x 4 reps with verbal cues on lengthening stride and going at a slower, more deliberate pace  THERAPEUTIC EXERCISE: Supine lumbar rocking with towel roll under lumbar spine to increase anterior tilt Supine P/ROM stretching bilateral hamstrings R more tight than L Bilateral hip adductor stretching  Seated bridge attempted with tactile cues      PT Long Term Goals - 02/20/15 1546    PT LONG TERM GOAL #1   Title Pt will perform HEP with family's assistance for improved posture, balance, gait.  TARGET 03/21/15   Time 4   Period Weeks   Status New   PT LONG TERM GOAL #2   Title Pt will improve Timed Up and Go test to less than or equal to 13.5 seconds for decreased fall risk.   Time 4   Period Weeks   Status New   PT LONG TERM GOAL #3   Title Pt will improve Dynamic Gait Index to at least 15/24 for decreased fall risk.   Time 4   Period Weeks   Status New   PT LONG TERM GOAL #4   Title Pt will tolerate at least 5 minutes of walking activities without loss of balance due to increased forward flexed posture and shuffling gait.   Time 4   Period Weeks   Status New   PT LONG TERM GOAL #5   Title Pt/wife will verbalize understanidng of fall prevention within the home environment.   Time 4  Period Weeks   Status New   Additional Long Term Goals   Additional Long Term Goals Yes   PT LONG TERM GOAL #6   Title Pt/wife will verbalize understanding of local Parkinson's disease related resources, including community fitness options.   Time 4   Period Weeks   Status New   PT LONG TERM GOAL #7   Title Pt will perform at least 8 of 10 reps of sit<>stand transfers with minimal to no UE support from 18 inch surfaces and lower, for improved efficiency and safety with transfers.   Time 4   Period Weeks   Status New               Plan - 03/11/15 1327    Clinical Impression Statement The patient has increased compliance with HEP per his wife.  He can  demonstrate improved posture within session with frequent verbal cues and tactile cues, but does not appear to carryover well from session to session.   PT Next Visit Plan Review additions to HEP; work again on [employer]! UP in standing to add to HEP; discuss techniques to decreased freezing with gait/turns and practice initiating gait with weightshifting   Consulted and Agree with Plan of Care Patient;Family member/caregiver   Family Member Consulted wife        Problem List Patient Active Problem List   Diagnosis Date Noted  . Parkinson's disease dementia (HCC) 09/17/2014  . BPH (benign prostatic hyperplasia) 09/17/2014  . Parkinson's disease (HCC) 09/17/2014    Advay Volante, PT 03/11/2015, 1:28 PM  [facility] [address] [place], [place], [address] Phone: [phone]   Fax:  [phone]  Name: Kanon Novosel MRN: [id number] Date of Birth: [birth year]

## 2015-03-13 ENCOUNTER — Ambulatory Visit: Payer: Medicare Other | Admitting: Physical Therapy

## 2015-03-13 ENCOUNTER — Ambulatory Visit: Payer: Medicare Other

## 2015-03-13 ENCOUNTER — Ambulatory Visit: Payer: Medicare Other | Admitting: Occupational Therapy

## 2015-03-13 DIAGNOSIS — R278 Other lack of coordination: Secondary | ICD-10-CM

## 2015-03-13 DIAGNOSIS — R293 Abnormal posture: Secondary | ICD-10-CM

## 2015-03-13 DIAGNOSIS — R4189 Other symptoms and signs involving cognitive functions and awareness: Secondary | ICD-10-CM

## 2015-03-13 DIAGNOSIS — R29898 Other symptoms and signs involving the musculoskeletal system: Secondary | ICD-10-CM

## 2015-03-13 DIAGNOSIS — R258 Other abnormal involuntary movements: Secondary | ICD-10-CM | POA: Diagnosis not present

## 2015-03-13 DIAGNOSIS — R471 Dysarthria and anarthria: Secondary | ICD-10-CM

## 2015-03-13 DIAGNOSIS — R279 Unspecified lack of coordination: Secondary | ICD-10-CM | POA: Diagnosis not present

## 2015-03-13 DIAGNOSIS — R269 Unspecified abnormalities of gait and mobility: Secondary | ICD-10-CM

## 2015-03-13 DIAGNOSIS — R2689 Other abnormalities of gait and mobility: Secondary | ICD-10-CM | POA: Diagnosis not present

## 2015-03-13 DIAGNOSIS — R41841 Cognitive communication deficit: Secondary | ICD-10-CM

## 2015-03-13 NOTE — Therapy (Signed)
[facility] [address] [place], [place], [address] Phone: [phone]   Fax:  [phone]  Speech Language Pathology Treatment  Patient Details  Name: Jacob Tran MRN: [id number] Date of Birth: [birth year] Referring Provider: Nyoka Lint Tat  Encounter Date: 03/13/2015      End of Session - 03/13/15 1631    Visit Number 5   Number of Visits 17   Date for SLP Re-Evaluation 04/16/15   SLP Start Time 1446   SLP Stop Time  1530   SLP Time Calculation (min) 44 min   Activity Tolerance Patient tolerated treatment well      Past Medical History  Diagnosis Date  . Parkinson's disease (HCC)   . Enlarged prostate   . Hyperlipidemia   . Hypertension     Past Surgical History  Procedure Laterality Date  . Tonsillectomy      There were no vitals filed for this visit.  Visit Diagnosis: Dysarthria  Cognitive communication deficit      Subjective Assessment - 03/13/15 1449    Patient is accompained by: Family member   Currently in Pain? No/denies               ADULT SLP TREATMENT - 03/13/15 1450    General Information   Behavior/Cognition Cooperative;Pleasant mood;Confused;Requires cueing   Treatment Provided   Treatment provided Cognitive-Linquistic   Cognitive-Linquistic Treatment   Treatment focused on Dysarthria   Skilled Treatment Pt did not greet SLP. SLP mentioned to pt he would feel good if pt greeted him first. Loud /a/ again targeted to re-calibrate pt's loudness to more WNL in conversation. Pt maintained average 84 dB with occasional min-mod A for loudness.  In structured sentence tasks, pt req'd extra time to respond consistently, and req'd usual min A for keeping volume </= 70dB. Pt indicated he was not frustrated by his difficulty with memory but that he did not like ithat he has difficulty (SLP assessing pt need/desire for memory notebook).   Assessment / Recommendations / Plan   Plan  Continue with current plan of care   Progression Toward Goals   Progression toward goals Progressing toward goals            SLP Short Term Goals - 03/13/15 1633    SLP SHORT TERM GOAL #1   Title Pt. will demonstrate loud /a/ average of 86dB over 3 sessions.   Time 2   Period Weeks   Status On-going   SLP SHORT TERM GOAL #2   Title Pt will demonstrate average of 70 dB 85% of structured speech tasks with rare min A in three sessions   Time 2   Period Weeks   Status Revised   SLP SHORT TERM GOAL #3   Title Pt will respond with 3-4 word utterances during structured speech tasks with occasional min A in three sessions   Time 2   Period Weeks   Status Revised   SLP SHORT TERM GOAL #4   Title Pt will initiate social greeting in the clinic 1x over 3 sessions with rare min A.   Time 2   Period Weeks   Status On-going          SLP Long Term Goals - 03/13/15 1634    SLP LONG TERM GOAL #1   Title Pt will average 70dB during simple conversation over 5 minutes with occasional min A   Time 6   Period Weeks   Status On-going   SLP LONG TERM  GOAL #2   Title Pt will request help or tool needed (ie pen, paper, calculator etc) during structured task with rare min A.   Time 6   Period Weeks   Status On-going   SLP LONG TERM GOAL #3   Title Pt will use 3-4 word utterances in simple conversation over 5 minutes with occasional min cues over three sessions   Time 6   Period Weeks   Status Revised          Plan - 03/13/15 1631    Clinical Impression Statement Pt requires min cues for loudness rarely in structured tasks today. His decr'd cognitive-linguistic skills hinder his overall progress for carryover, however wife states she thinks she asks him less to repeat himself. He requires skilled ST to cont to improve his loudness in conversation/responding to a speaker, and to provide wife with compnsations to ease caregiver burden re: communcication.   Speech Therapy Frequency 2x /  week   Duration --  6 weeks   Treatment/Interventions Cognitive reorganization;Internal/external aids;Compensatory strategies;SLP instruction and feedback;Patient/family education;Functional tasks;Multimodal communcation approach   Potential to Achieve Goals Fair   Potential Considerations Cooperation/participation level;Medical prognosis;Severity of impairments        Problem List Patient Active Problem List   Diagnosis Date Noted  . Parkinson's disease dementia (HCC) 09/17/2014  . BPH (benign prostatic hyperplasia) 09/17/2014  . Parkinson's disease (HCC) 09/17/2014    [facility] ,MS, CCC-SLP  03/13/2015, 4:35 PM  [facility] [address] [place], [place], [address] Phone: [phone]   Fax:  [phone]   Name: Jacob Tran MRN: [id number] Date of Birth: 20-Aug-1933

## 2015-03-13 NOTE — Patient Instructions (Signed)
Coordination Exercises  Perform the following exercises for 20 minutes 1 times per day. Perform with both hand(s). Perform using big movements.  Flipping Cards: Place deck of cards on the table. Flip cards over by opening your hand big to grasp and then turn your palm up big. Deal cards: Hold 1/2 or whole deck in your hand. Use thumb to push card off top of deck with one big push. Rotate ball with fingertips: Pick up with fingers/thumb and move as much as you can with each turn/movement (clockwise and counter-clockwise). Toss ball from one hand to the other: Toss big/high. Pick up coins and place in coin bank or container: Pick up with big, intentional movements. Do not drag coin to the edge. Pick up coins and stack one at a time: Pick up with big, intentional movements. Do not drag coin to the edge. (5-10 in a stack) Pick up 5-10 coins one at a time and hold in palm. Then, move coins from palm to fingertips one at time and place in coin bank/container. Practice writing: Slow down, write big, and focus on forming each letter. Perform "Flicks"/hand stretches (PWR! Hands): Close hands then flick out your fingers with focus on opening hands, pulling wrists back, and extending elbows like you are pushing. 

## 2015-03-13 NOTE — Patient Instructions (Signed)
  Please complete the assigned speech therapy homework and return it to your next session.  

## 2015-03-14 ENCOUNTER — Ambulatory Visit: Payer: Medicare Other

## 2015-03-14 DIAGNOSIS — R41841 Cognitive communication deficit: Secondary | ICD-10-CM

## 2015-03-14 DIAGNOSIS — R279 Unspecified lack of coordination: Secondary | ICD-10-CM | POA: Diagnosis not present

## 2015-03-14 DIAGNOSIS — R2689 Other abnormalities of gait and mobility: Secondary | ICD-10-CM | POA: Diagnosis not present

## 2015-03-14 DIAGNOSIS — R4189 Other symptoms and signs involving cognitive functions and awareness: Secondary | ICD-10-CM | POA: Diagnosis not present

## 2015-03-14 DIAGNOSIS — R258 Other abnormal involuntary movements: Secondary | ICD-10-CM | POA: Diagnosis not present

## 2015-03-14 DIAGNOSIS — R471 Dysarthria and anarthria: Secondary | ICD-10-CM

## 2015-03-14 DIAGNOSIS — R29898 Other symptoms and signs involving the musculoskeletal system: Secondary | ICD-10-CM | POA: Diagnosis not present

## 2015-03-14 NOTE — Therapy (Signed)
[facility] [address] [place], [place], [address] Phone: [phone]   Fax:  [phone]  Occupational Therapy Treatment  Patient Details  Name: Jacob Tran MRN: [id number] Date of Birth: Jun 05, 1933 Referring Provider: Dr. Lurena Joiner Tat  Encounter Date: 03/13/2015      OT End of Session - 03/14/15 1728    Visit Number 3   Number of Visits 17   Date for OT Re-Evaluation 04/19/15   Authorization Type Medicare/BCBS, need G code   Authorization - Visit Number 3   Authorization - Number of Visits 10   OT Start Time 1107   OT Stop Time 1145   OT Time Calculation (min) 38 min   Activity Tolerance Patient tolerated treatment well   Behavior During Therapy [facility] for tasks assessed/performed      Past Medical History  Diagnosis Date  . Parkinson's disease (HCC)   . Enlarged prostate   . Hyperlipidemia   . Hypertension     Past Surgical History  Procedure Laterality Date  . Tonsillectomy      There were no vitals filed for this visit.  Visit Diagnosis:  Bradykinesia  Decreased coordination  Rigidity  Cognitive deficits  Posture abnormality      Subjective Assessment - 03/14/15 1726    Patient is accompained by: Family member   Pertinent History PD (diagnosed since 2010); Parkinson's disease dementia, hx of "back problems"   Limitations PD dementia   Patient Stated Goals improve speech, walking, and decr time for ADLs   Currently in Pain? No/denies          Coordination HEP issued, mod v.c. Required larger movements and for correct sequence. Pt's wife is able to cue him.               PWR [facility]) - 03/13/15 1349    PWR! exercises Moves in supine   PWR! Up 10   PWR! Rock 20   PWR! Twist 20   PWR! Step 10  stepping separate from lifting hips   Comments min-mod v.c             OT Education - 03/14/15 1732    Education provided Yes   Education Details coordination HEP   Person(s) Educated Patient;Spouse   Methods Explanation;Demonstration;Verbal cues;Handout   Comprehension Verbalized understanding;Returned demonstration;Verbal cues required          OT Short Term Goals - 02/19/15 2053    OT SHORT TERM GOAL #1   Title Pt/wife will be independent with PD-specific HEP.--check STGs 03/20/15   Time 4   Period Weeks   Status New   OT SHORT TERM GOAL #2   Title Pt/wife will verbalize understanding of ways to prevent future complications and appropriate community resources.   Time 4   Period Weeks   Status New   OT SHORT TERM GOAL #3   Title Pt will improve coordination for ADLs as shown by improving time on 9-hole peg test by at least 4sec bilaterally.   Baseline R-50.75sec, L-45.28sec   Time 4   Period Weeks   Status New           OT Long Term Goals - 02/19/15 2056    OT LONG TERM GOAL #1   Title Pt/wife will verbalize understanding of AE/strategies to incr ease with ADLs prn.--check LTGs 04/19/15   Time 8   Period Weeks   Status New   OT LONG TERM GOAL #2   Title Pt will improve functional  reaching for ADLs as shown by improving score on box and blocks test by at least 5 with LUE.   Baseline 30 blocks   Period Weeks   Status New   OT LONG TERM GOAL #3   Title Pt will improve coordination for ADLs as shown by improving time on 9-hole peg test by at least 8 sec with RUE.   Baseline 50.75sec   Period Weeks   Status New   OT LONG TERM GOAL #4   Title Pt will be able to fasten/unfasten 3 buttons on table by at least 4sec.   Baseline 42.0sec   Time 8   Period Weeks   Status New   OT LONG TERM GOAL #5   Title Pt will demo decr bradykinesia as shown by demo full R elbow extension with overhead reaching at least 90% of the time.   Time 8   Period Weeks   Status New               Plan - 03/13/15 1353    Clinical Impression Statement Pt is progressing towards goals. Pt demonstrates improved performance of PWR! moves in supine.     Pt will benefit from skilled therapeutic intervention in order to improve on the following deficits (Retired) Decreased cognition;Decreased mobility;Decreased strength;Impaired UE functional use;Decreased knowledge of use of DME;Decreased balance;Decreased activity tolerance;Impaired tone;Decreased coordination;Decreased range of motion   Rehab Potential Good   Clinical Impairments Affecting Rehab Potential PD dementia/cognitive deficits, decr initiation   OT Frequency 2x / week   OT Duration 8 weeks   OT Treatment/Interventions Self-care/ADL training;Therapeutic exercise;Functional Mobility Training;Patient/family education;Balance training;Splinting;Manual Therapy;Neuromuscular education;Ultrasound;Energy conservation;Therapeutic exercises;Therapeutic activities;DME and/or AE instruction;Cryotherapy;Electrical Stimulation;Fluidtherapy;Cognitive remediation/compensation;Visual/perceptual remediation/compensation;Passive range of motion;Contrast Bath;Moist Heat   Plan reinforce coordination, PWR! moves seated   OT Home Exercise Plan issued PWR! supine, seated, coordination   Consulted and Agree with Plan of Care Patient;Family member/caregiver        Problem List Patient Active Problem List   Diagnosis Date Noted  . Parkinson's disease dementia (HCC) 09/17/2014  . BPH (benign prostatic hyperplasia) 09/17/2014  . Parkinson's disease (HCC) 09/17/2014    Tran,Jacob 03/14/2015, 5:33 PM  [facility] [address] [place], [place], [address] Phone: [phone]   Fax:  [phone]  Name: Jacob Tran MRN: [id number] Date of Birth: March 31, 1933

## 2015-03-14 NOTE — Therapy (Signed)
[facility] [address] [place], [place], [address] Phone: [phone]   Fax:  [phone]  Speech Language Pathology Treatment  Patient Details  Name: Jacob Tran MRN: [id number] Date of Birth: 05/04/1933 Referring Provider: Rebeccal Tat  Encounter Date: 03/14/2015      End of Session - 03/14/15 1640    Visit Number 6   Number of Visits 17   Date for SLP Re-Evaluation 04/16/15   SLP Start Time 1320   SLP Stop Time  1405   SLP Time Calculation (min) 45 min   Activity Tolerance Patient tolerated treatment well      Past Medical History  Diagnosis Date  . Parkinson's disease (HCC)   . Enlarged prostate   . Hyperlipidemia   . Hypertension     Past Surgical History  Procedure Laterality Date  . Tonsillectomy      There were no vitals filed for this visit.  Visit Diagnosis: Dysarthria  Cognitive communication deficit      Subjective Assessment - 03/14/15 1323    Subjective Pt greeted SLP without cues.   Patient is accompained by: Family member   Currently in Pain? No/denies               ADULT SLP TREATMENT - 03/14/15 1329    General Information   Behavior/Cognition Cooperative;Pleasant mood;Confused;Requires cueing   Treatment Provided   Treatment provided Cognitive-Linquistic   Cognitive-Linquistic Treatment   Treatment focused on Dysarthria   Skilled Treatment Pt greeted SLP. Loud /a/ again targeted to re-calibrate pt's loudness to more WNL in conversation. SLP educated pt and wife re: abdominal breathing and had pt practice with loud /a/ with mixed results. In structured word tasks, pt req'd extra time to respond consistently, In more complex linguistic responses (phrases and sentences), pt again req'd extra time consistently, as well as occasional min A for keeping volume </= 70dB. Pt's wife became tearful in session today due to caregiver pressure. SLP suggested respite care and wife  responded pt "won't allow me to do that." SLP suggested family member or close friend known to both pt/wife to come regularly to provide respite.    Assessment / Recommendations / Plan   Plan Continue with current plan of care   Progression Toward Goals   Progression toward goals Progressing toward goals          SLP Education - 03/14/15 1639    Education provided Yes   Education Details abdominal breathing   Person(s) Educated Patient;Spouse   Methods Explanation;Demonstration;Verbal cues   Comprehension Verbalized understanding;Returned demonstration;Verbal cues required;Need further instruction          SLP Short Term Goals - 03/14/15 1641    SLP SHORT TERM GOAL #1   Title Pt. will demonstrate loud /a/ average of 86dB over 3 sessions.   Time 2   Period Weeks   Status On-going   SLP SHORT TERM GOAL #2   Title Pt will demonstrate average of 70 dB 85% of structured speech tasks with rare min A in three sessions   Time 2   Period Weeks   Status Revised   SLP SHORT TERM GOAL #3   Title Pt will respond with 3-4 word utterances during structured speech tasks with occasional min A in three sessions   Time 2   Period Weeks   Status Revised   SLP SHORT TERM GOAL #4   Title Pt will initiate social greeting in the clinic 1x over 3 sessions with  rare min A.   Baseline one session 03-14-15   Time 2   Period Weeks   Status On-going          SLP Long Term Goals - 03/14/15 1641    SLP LONG TERM GOAL #1   Title Pt will average 70dB during simple conversation over 5 minutes with occasional min A   Time 6   Period Weeks   Status On-going   SLP LONG TERM GOAL #2   Title Pt will request help or tool needed (ie pen, paper, calculator etc) during structured task with rare min A.   Time 6   Period Weeks   Status On-going   SLP LONG TERM GOAL #3   Title Pt will use 3-4 word utterances in simple conversation over 5 minutes with occasional min cues over three sessions   Time 6    Period Weeks   Status Revised          Plan - 03/14/15 1640    Clinical Impression Statement Pt requires min cues for loudness rarely in structured tasks today. His decr'd cognitive-linguistic skills hinder his overall progress for carryover, however wife states she thinks she asks him less to repeat himself. He requires skilled ST to cont to improve his loudness in conversation/responding to a speaker, and to provide wife with compnsations to ease caregiver burden re: communcication.   Speech Therapy Frequency 2x / week   Duration --  6 weeks   Treatment/Interventions Cognitive reorganization;Internal/external aids;Compensatory strategies;SLP instruction and feedback;Patient/family education;Functional tasks;Multimodal communcation approach   Potential to Achieve Goals Fair   Potential Considerations Cooperation/participation level;Medical prognosis;Severity of impairments        Problem List Patient Active Problem List   Diagnosis Date Noted  . Parkinson's disease dementia (HCC) 09/17/2014  . BPH (benign prostatic hyperplasia) 09/17/2014  . Parkinson's disease (HCC) 09/17/2014    [facility] ,MS, CCC-SLP  03/14/2015, 4:42 PM  [facility] [address] [place], [place], [address] Phone: [phone]   Fax:  [phone]   Name: Jacob Tran MRN: [id number] Date of Birth: Oct 07, 1933

## 2015-03-14 NOTE — Therapy (Signed)
[facility] [address] [place], [place], [address] Phone: [phone]   Fax:  [phone]  Physical Therapy Treatment  Patient Details  Name: Jacob Tran MRN: [id number] Date of Birth: [birth year] Referring Provider: Kerin Salen  Encounter Date: 03/13/2015      PT End of Session - 03/14/15 1952    Visit Number 7   Number of Visits 9   Date for PT Re-Evaluation 04/20/15   Authorization Type Medicare/BCBS-GCODE every 10th visit   PT Start Time 1401   PT Stop Time 1447   PT Time Calculation (min) 46 min   Equipment Utilized During Treatment Gait belt   Activity Tolerance Patient tolerated treatment well   Behavior During Therapy [facility] for tasks assessed/performed      Past Medical History  Diagnosis Date  . Parkinson's disease (HCC)   . Enlarged prostate   . Hyperlipidemia   . Hypertension     Past Surgical History  Procedure Laterality Date  . Tonsillectomy      There were no vitals filed for this visit.  Visit Diagnosis:  Postural instability  Posture abnormality  Abnormality of gait      Subjective Assessment - 03/13/15 1406    Subjective Pt reports no falls since last session of PT.   Patient is accompained by: Family member   Patient Stated Goals Pt's goal is to help with gait-wife wants to help with shuffling gait, to stop sliding feet along.   Currently in Pain? No/denies               Therapeutic Exercise: -Seated best posture position with scapular squeezes and upright posture x 5 reps -Review of sitting hamstring stretches provided several sessions ago:  Pt return demo seated hamstring stretch with verbal cues.  Wife reports doing this at home with foot propped on stool.   Gait:  Gait training activity x 120 ft using bilateral walking poles, for facilitation of reciprocal arm swing with therapist tech providing close supervision.  Gait x 240 ft, 2 reps without walking poles,  with cues for arm swing, cues for upright posture and increased step length.  PT provides verbal cues for patient to stop to reset posture, then tactile cues to start with improved posture.  Pt ambulates 100 ft x 2 reps, with starts, stops, turns with frequent postural cues and cues for increased step length.  With arm swing with gait, pt does not appear to demonstrate festinating/shuffling with gait.  Discussed with wife providing cues to patient for standing with wide BOS and weigthshifting, use of reciprocal arm swing to decrease shuffling gait pattern.     Neuro Re-education:       PWR [facility]) - 03/14/15 1945    PWR! Up x 10   PWR! Rock x 10    PWR! Twist x 10   PWR! Step x 10   Comments Performed in modified quadruped position standing at counter with verbal and tactile cues.  Added modified quadruped PWR! Rock (anterior/posterior direction) to HEP      Sit<>stand transfers from 18inch chair, 10 reps with cues for upright posture, through buttocks, quads.       PT Education - 03/14/15 1951    Education provided Yes   Education Details PWR! Moves in modified quadruped added to HEP; arm swing with gait   Person(s) Educated Patient;Spouse   Methods Explanation;Demonstration;Handout   Comprehension Verbalized understanding;Returned demonstration;Verbal cues required  PT Long Term Goals - 02/20/15 1546    PT LONG TERM GOAL #1   Title Pt will perform HEP with family's assistance for improved posture, balance, gait.  TARGET 03/21/15   Time 4   Period Weeks   Status New   PT LONG TERM GOAL #2   Title Pt will improve Timed Up and Go test to less than or equal to 13.5 seconds for decreased fall risk.   Time 4   Period Weeks   Status New   PT LONG TERM GOAL #3   Title Pt will improve Dynamic Gait Index to at least 15/24 for decreased fall risk.   Time 4   Period Weeks   Status New   PT LONG TERM GOAL #4   Title Pt will tolerate at least 5 minutes of walking  activities without loss of balance due to increased forward flexed posture and shuffling gait.   Time 4   Period Weeks   Status New   PT LONG TERM GOAL #5   Title Pt/wife will verbalize understanidng of fall prevention within the home environment.   Time 4   Period Weeks   Status New   Additional Long Term Goals   Additional Long Term Goals Yes   PT LONG TERM GOAL #6   Title Pt/wife will verbalize understanding of local Parkinson's disease related resources, including community fitness options.   Time 4   Period Weeks   Status New   PT LONG TERM GOAL #7   Title Pt will perform at least 8 of 10 reps of sit<>stand transfers with minimal to no UE support from 18 inch surfaces and lower, for improved efficiency and safety with transfers.   Time 4   Period Weeks   Status New               Plan - 03/14/15 1953    Clinical Impression Statement Pt tolerates the modified quadruped position for PWR! Moves activities well, especially with anterior/posterior rocking direction for improved posture.  Pt also responds well to cues for arm swing with gait after using bilateral walking poles.  Continue to question pt's carryover ability, even wtih wif'es cues for arm swing.  Pt will continue to benefit from further skilled PT to address comprehensive exercise program for posture, balance and gait.   Pt will benefit from skilled therapeutic intervention in order to improve on the following deficits Abnormal gait;Decreased activity tolerance;Decreased balance;Decreased mobility;Decreased endurance;Decreased safety awareness;Decreased strength;Difficulty walking;Postural dysfunction   Rehab Potential Good   PT Frequency 2x / week   PT Duration 4 weeks   PT Treatment/Interventions ADLs/Self Care Home Management;Therapeutic exercise;Therapeutic activities;Functional mobility training;Gait training;Balance training;Neuromuscular re-education;Patient/family education   PT Next Visit Plan Review  additions to HEP; PWR! Moves in modified quadruped position; begin checking goals   Consulted and Agree with Plan of Care Patient;Family member/caregiver   Family Member Consulted wife        Problem List Patient Active Problem List   Diagnosis Date Noted  . Parkinson's disease dementia (HCC) 09/17/2014  . BPH (benign prostatic hyperplasia) 09/17/2014  . Parkinson's disease (HCC) 09/17/2014    Barclay Lennox W. 03/14/2015, 8:02 PM  Gean Maidens., PT  [place] [facility] [address] [place], [place], [address] Phone: [phone]   Fax:  [phone]  Name: Jacob Tran MRN: [id number] Date of Birth: 10/06/1933

## 2015-03-14 NOTE — Patient Instructions (Signed)
PWR! Moves modified quadruped:  PWR! Rock x 10 reps -added to HEP to perform 1-2 times per day

## 2015-03-19 ENCOUNTER — Ambulatory Visit: Payer: Medicare Other | Admitting: Rehabilitative and Restorative Service Providers"

## 2015-03-19 ENCOUNTER — Ambulatory Visit: Payer: Medicare Other | Admitting: Occupational Therapy

## 2015-03-19 ENCOUNTER — Ambulatory Visit: Payer: Medicare Other | Admitting: Speech Pathology

## 2015-03-19 DIAGNOSIS — R471 Dysarthria and anarthria: Secondary | ICD-10-CM

## 2015-03-19 DIAGNOSIS — R29898 Other symptoms and signs involving the musculoskeletal system: Secondary | ICD-10-CM

## 2015-03-19 DIAGNOSIS — R2689 Other abnormalities of gait and mobility: Secondary | ICD-10-CM | POA: Diagnosis not present

## 2015-03-19 DIAGNOSIS — R279 Unspecified lack of coordination: Secondary | ICD-10-CM

## 2015-03-19 DIAGNOSIS — R258 Other abnormal involuntary movements: Secondary | ICD-10-CM

## 2015-03-19 DIAGNOSIS — R4189 Other symptoms and signs involving cognitive functions and awareness: Secondary | ICD-10-CM

## 2015-03-19 DIAGNOSIS — R278 Other lack of coordination: Secondary | ICD-10-CM

## 2015-03-19 DIAGNOSIS — R269 Unspecified abnormalities of gait and mobility: Secondary | ICD-10-CM

## 2015-03-19 DIAGNOSIS — R293 Abnormal posture: Secondary | ICD-10-CM

## 2015-03-19 NOTE — Therapy (Signed)
[facility] [address] [place], [place], [address] Phone: [phone]   Fax:  [phone]  Speech Language Pathology Treatment  Patient Details  Name: Jacob Tran MRN: [id number] Date of Birth: April 25, 1933 Referring Provider: Nyoka Lint Tat  Encounter Date: 03/19/2015      End of Session - 03/19/15 1504    Visit Number 7   Number of Visits 17   Date for SLP Re-Evaluation 04/16/15   SLP Start Time 1403   SLP Stop Time  1445   SLP Time Calculation (min) 42 min   Activity Tolerance Patient tolerated treatment well      Past Medical History  Diagnosis Date  . Parkinson's disease (HCC)   . Enlarged prostate   . Hyperlipidemia   . Hypertension     Past Surgical History  Procedure Laterality Date  . Tonsillectomy      There were no vitals filed for this visit.  Visit Diagnosis: Dysarthria      Subjective Assessment - 03/19/15 1406    Subjective Pt greeted ST "Hi" when ST approached him and sat next to him   Patient is accompained by: Family member               ADULT SLP TREATMENT - 03/19/15 1409    General Information   Behavior/Cognition Cooperative;Pleasant mood;Confused;Requires cueing   Treatment Provided   Treatment provided Cognitive-Linquistic   Cognitive-Linquistic Treatment   Treatment focused on Dysarthria   Skilled Treatment Pt greeted SLP with "Hi" and demonstrated social response to session ending. Loud /a/ to recalibrate loudness for conversation average of  85dB after verbal cues and modeling. Word responses in structured speech task averaged 70dB with usual min verbal cues for breath support and loudness. Phrase/sentence responses averaged 67dB with usual mod cues for loudness - as cognitive load became increased, loudness decreased.  Pt responded to conversational/personal questions with phrase/sentece with usual verbal cue "tell me more."    Assessment / Recommendations / Plan   Plan Continue with current plan of care   Progression Toward Goals   Progression toward goals Progressing toward goals            SLP Short Term Goals - 03/19/15 1503    SLP SHORT TERM GOAL #1   Title Pt. will demonstrate loud /a/ average of 86dB over 3 sessions.   Time 1   Period Weeks   Status On-going   SLP SHORT TERM GOAL #2   Title Pt will demonstrate average of 70 dB 85% of structured speech tasks with rare min A in three sessions   Time 1   Period Weeks   Status Revised   SLP SHORT TERM GOAL #3   Title Pt will respond with 3-4 word utterances during structured speech tasks with occasional min A in three sessions   Time 1   Period Weeks   Status Revised   SLP SHORT TERM GOAL #4   Title Pt will initiate social greeting in the clinic 1x over 3 sessions with rare min A.   Baseline one session 03-14-15, 03/19/15,    Time 1   Period Weeks   Status On-going          SLP Long Term Goals - 03/19/15 1504    SLP LONG TERM GOAL #1   Title Pt will average 70dB during simple conversation over 5 minutes with occasional min A   Time 5   Period Weeks   Status On-going   SLP LONG  TERM GOAL #2   Title Pt will request help or tool needed (ie pen, paper, calculator etc) during structured task with rare min A.   Time 5   Period Weeks   Status On-going   SLP LONG TERM GOAL #3   Title Pt will use 3-4 word utterances in simple conversation over 5 minutes with occasional min cues over three sessions   Time 5   Period Weeks   Status Revised          Plan - 03/19/15 1500    Clinical Impression Statement Pt required usual min to mod A to carryover loudness at phrase /sentence level responding to speaker, and min A to increase utterance length to 4-7 words. Continue skilled ST to maximize carryover of loudness and response length.    Speech Therapy Frequency 2x / week   Treatment/Interventions Cognitive reorganization;Internal/external aids;Compensatory strategies;SLP instruction  and feedback;Patient/family education;Functional tasks;Multimodal communcation approach   Potential Considerations Cooperation/participation level;Medical prognosis;Severity of impairments;Ability to learn/carryover information   Consulted and Agree with Plan of Care Patient;Family member/caregiver   Family Member Consulted spouse, [name]        Problem List Patient Active Problem List   Diagnosis Date Noted  . Parkinson's disease dementia (HCC) 09/17/2014  . BPH (benign prostatic hyperplasia) 09/17/2014  . Parkinson's disease (HCC) 09/17/2014    Lovvorn, Radene Journey MS, CCC-SLP 03/19/2015, 3:05 PM  [facility] [address] [place], [place], [address] Phone: [phone]   Fax:  [phone]   Name: Jacob Tran MRN: [id number] Date of Birth: [birth year]

## 2015-03-19 NOTE — Therapy (Signed)
[facility] [address] [place], [place], [address] Phone: [phone]   Fax:  [phone]  Occupational Therapy Treatment  Patient Details  Name: Jacob Tran MRN: [id number] Date of Birth: [birth year] Referring Provider: Dr. Lurena Joiner Tat  Encounter Date: 03/19/2015      OT End of Session - 03/19/15 1359    Visit Number 4   Number of Visits 17   Date for OT Re-Evaluation 04/19/15   Authorization Type Medicare/BCBS, need G code   Authorization - Visit Number 4   Authorization - Number of Visits 10   OT Start Time 1321   OT Stop Time 1400   OT Time Calculation (min) 39 min   Activity Tolerance Patient tolerated treatment well   Behavior During Therapy [facility] for tasks assessed/performed      Past Medical History  Diagnosis Date  . Parkinson's disease (HCC)   . Enlarged prostate   . Hyperlipidemia   . Hypertension     Past Surgical History  Procedure Laterality Date  . Tonsillectomy      There were no vitals filed for this visit.  Visit Diagnosis:  Bradykinesia  Decreased coordination  Rigidity  Cognitive deficits      Subjective Assessment - 03/19/15 1354    Subjective  Denies pain   Pertinent History PD (diagnosed since 2010); Parkinson's disease dementia, hx of "back problems"   Limitations PD dementia   Patient Stated Goals improve speech, walking, and decr time for ADLs   Currently in Pain? No/denies                         [facility]) - 03/19/15 1659    PWR! exercises Moves in [place]! Rock 10   PWR! Twist 10   Comments modified at counter, mod v.c.   PWR! Up 10   PWR! Rock 10   PWR! Twist 10   PWR! Step 10   Comments min v.c.   Reaching Comments Dynamic reaching with trunk rotation and PWR! hands in seated to  simulate ADLS, min-mod v.c.  cueing for large amplitude movements               OT Short Term Goals - 02/19/15 2053    OT SHORT TERM GOAL  #1   Title Pt/wife will be independent with PD-specific HEP.--check STGs 03/20/15   Time 4   Period Weeks   Status New   OT SHORT TERM GOAL #2   Title Pt/wife will verbalize understanding of ways to prevent future complications and appropriate community resources.   Time 4   Period Weeks   Status New   OT SHORT TERM GOAL #3   Title Pt will improve coordination for ADLs as shown by improving time on 9-hole peg test by at least 4sec bilaterally.   Baseline R-50.75sec, L-45.28sec   Time 4   Period Weeks   Status New           OT Long Term Goals - 02/19/15 2056    OT LONG TERM GOAL #1   Title Pt/wife will verbalize understanding of AE/strategies to incr ease with ADLs prn.--check LTGs 04/19/15   Time 8   Period Weeks   Status New   OT LONG TERM GOAL #2   Title Pt will improve functional reaching for ADLs as shown by improving score on box and blocks test by at least 5 with LUE.   Baseline 30 blocks  Period Weeks   Status New   OT LONG TERM GOAL #3   Title Pt will improve coordination for ADLs as shown by improving time on 9-hole peg test by at least 8 sec with RUE.   Baseline 50.75sec   Period Weeks   Status New   OT LONG TERM GOAL #4   Title Pt will be able to fasten/unfasten 3 buttons on table by at least 4sec.   Baseline 42.0sec   Time 8   Period Weeks   Status New   OT LONG TERM GOAL #5   Title Pt will demo decr bradykinesia as shown by demo full R elbow extension with overhead reaching at least 90% of the time.   Time 8   Period Weeks   Status New               Plan - 03/19/15 1358    Clinical Impression Statement Pt is progressing slowly towards goals limited by postural abnormality, and cognitive deficits.   Pt will benefit from skilled therapeutic intervention in order to improve on the following deficits (Retired) Decreased cognition;Decreased mobility;Decreased strength;Impaired UE functional use;Decreased knowledge of use of DME;Decreased  balance;Decreased activity tolerance;Impaired tone;Decreased coordination;Decreased range of motion   Clinical Impairments Affecting Rehab Potential PD dementia/cognitive deficits, decr initiation   OT Frequency 2x / week   OT Duration 8 weeks   OT Treatment/Interventions Self-care/ADL training;Therapeutic exercise;Functional Mobility Training;Patient/family education;Balance training;Splinting;Manual Therapy;Neuromuscular education;Ultrasound;Energy conservation;Therapeutic exercises;Therapeutic activities;DME and/or AE instruction;Cryotherapy;Electrical Stimulation;Fluidtherapy;Cognitive remediation/compensation;Visual/perceptual remediation/compensation;Passive range of motion;Contrast Bath;Moist Heat   Plan check short term goals,reinforce coordination, PWR! moves seated    OT Home Exercise Plan issued PWR! supine, seated, coordination   Consulted and Agree with Plan of Care Patient;Family member/caregiver        Problem List Patient Active Problem List   Diagnosis Date Noted  . Parkinson's disease dementia (HCC) 09/17/2014  . BPH (benign prostatic hyperplasia) 09/17/2014  . Parkinson's disease (HCC) 09/17/2014    Jacob Tran 03/19/2015, 5:01 PM  [facility] [address] [place], [place], [address] Phone: [phone]   Fax:  [phone]  Name: Jacob Tran MRN: [id number] Date of Birth: [birth year]

## 2015-03-20 NOTE — Therapy (Signed)
[facility] [address] [place], [place], [address] Phone: [phone]   Fax:  [phone]  Physical Therapy Treatment  Patient Details  Name: Myles Tavella MRN: [id number] Date of Birth: January 22, 1933 Referring Provider: Kerin Salen  Encounter Date: 03/19/2015      PT End of Session - 03/19/15 0841    Visit Number 8   Number of Visits 9   Date for PT Re-Evaluation 04/20/15   Authorization Type Medicare/BCBS-GCODE every 10th visit   PT Start Time 1238   PT Stop Time 1320   PT Time Calculation (min) 42 min   Equipment Utilized During Treatment Gait belt   Activity Tolerance Patient tolerated treatment well   Behavior During Therapy [facility] for tasks assessed/performed      Past Medical History  Diagnosis Date  . Parkinson's disease (HCC)   . Enlarged prostate   . Hyperlipidemia   . Hypertension     Past Surgical History  Procedure Laterality Date  . Tonsillectomy      There were no vitals filed for this visit.  Visit Diagnosis:  Abnormality of gait  Posture abnormality  Decreased coordination  Rigidity      Subjective Assessment - 03/19/15 1240    Subjective Patient reports no falls since last session.  He is doing his home exercise program.  He reports most bothered by shuffling steps and forward posture.   Patient is accompained by: Family member   Patient Stated Goals Pt's goal is to help with gait-wife wants to help with shuffling gait, to stop sliding feet along.   Currently in Pain? No/denies      NEUROMUSCULAR RE-EDUCATION: Quadriped rocking for trunk mobility with CGA Quadriped to tall kneeling with min A for facilitation of trunk upright x 4 reps Standing weight shifting attempting to use rocker board with min to mod A Standing hip strategy and stepping strategy activities for improved balance control PWR! Up standing posture Cues with turns encouraging wider base of  support  Gait: Ambulation working on slow, longer steps with CGA and tactile cues for posture and arm swing x 230 feet x 3 reps.       PT Long Term Goals - 02/20/15 1546    PT LONG TERM GOAL #1   Title Pt will perform HEP with family's assistance for improved posture, balance, gait.  TARGET 03/21/15   Time 4   Period Weeks   Status New   PT LONG TERM GOAL #2   Title Pt will improve Timed Up and Go test to less than or equal to 13.5 seconds for decreased fall risk.   Time 4   Period Weeks   Status New   PT LONG TERM GOAL #3   Title Pt will improve Dynamic Gait Index to at least 15/24 for decreased fall risk.   Time 4   Period Weeks   Status New   PT LONG TERM GOAL #4   Title Pt will tolerate at least 5 minutes of walking activities without loss of balance due to increased forward flexed posture and shuffling gait.   Time 4   Period Weeks   Status New   PT LONG TERM GOAL #5   Title Pt/wife will verbalize understanidng of fall prevention within the home environment.   Time 4   Period Weeks   Status New   Additional Long Term Goals   Additional Long Term Goals Yes   PT LONG TERM GOAL #6   Title Pt/wife will  verbalize understanding of local Parkinson's disease related resources, including community fitness options.   Time 4   Period Weeks   Status New   PT LONG TERM GOAL #7   Title Pt will perform at least 8 of 10 reps of sit<>stand transfers with minimal to no UE support from 18 inch surfaces and lower, for improved efficiency and safety with transfers.   Time 4   Period Weeks   Status New               Plan - 03/20/15 0841    Clinical Impression Statement [name] reports increased carryover to home of upright posture and performing home program.  She feels that he is benefitting from continued PT.  Plan to check LTGs and renew for 2-4 more weeks to continue to work on postural strengthening and safety during mobility and ADLs.   PT Next Visit Plan Check  LTGs/ recert/ review HEP, pwr! in modified quadruped position.   Consulted and Agree with Plan of Care Patient;Family member/caregiver   Family Member Consulted wife        Problem List Patient Active Problem List   Diagnosis Date Noted  . Parkinson's disease dementia (HCC) 09/17/2014  . BPH (benign prostatic hyperplasia) 09/17/2014  . Parkinson's disease (HCC) 09/17/2014    Jayanth Szczesniak, PT 03/20/2015, 8:43 AM  [facility] [address] [place], [place], [address] Phone: [phone]   Fax:  [phone]  Name: Tara Wich MRN: [id number] Date of Birth: [birth year]

## 2015-03-22 ENCOUNTER — Ambulatory Visit: Payer: Medicare Other | Admitting: Occupational Therapy

## 2015-03-22 ENCOUNTER — Ambulatory Visit: Payer: Medicare Other | Attending: Neurology | Admitting: Rehabilitative and Restorative Service Providers"

## 2015-03-22 DIAGNOSIS — R279 Unspecified lack of coordination: Secondary | ICD-10-CM

## 2015-03-22 DIAGNOSIS — R4189 Other symptoms and signs involving cognitive functions and awareness: Secondary | ICD-10-CM

## 2015-03-22 DIAGNOSIS — R269 Unspecified abnormalities of gait and mobility: Secondary | ICD-10-CM | POA: Diagnosis not present

## 2015-03-22 DIAGNOSIS — R293 Abnormal posture: Secondary | ICD-10-CM

## 2015-03-22 DIAGNOSIS — R471 Dysarthria and anarthria: Secondary | ICD-10-CM | POA: Insufficient documentation

## 2015-03-22 DIAGNOSIS — R41841 Cognitive communication deficit: Secondary | ICD-10-CM | POA: Diagnosis not present

## 2015-03-22 DIAGNOSIS — R258 Other abnormal involuntary movements: Secondary | ICD-10-CM

## 2015-03-22 DIAGNOSIS — R29898 Other symptoms and signs involving the musculoskeletal system: Secondary | ICD-10-CM

## 2015-03-22 DIAGNOSIS — R2681 Unsteadiness on feet: Secondary | ICD-10-CM | POA: Insufficient documentation

## 2015-03-22 DIAGNOSIS — R278 Other lack of coordination: Secondary | ICD-10-CM

## 2015-03-22 NOTE — Therapy (Signed)
Jacob Tran [address] [place], [place], [address] Phone: [phone]   Fax:  [phone]  Physical Therapy Treatment  Patient Details  Name: Jacob Tran MRN: [id number] Date of Birth: 15-Sep-1933 Referring Provider: Alonza Bogus  Encounter Date: 03/22/2015      PT End of Session - 03/22/15 1445    Visit Number 9   Number of Visits 17   Date for PT Re-Evaluation 04/20/15   Authorization Type Medicare/BCBS-GCODE every 10th visit   PT Start Time 1435   PT Stop Time 1520   PT Time Calculation (min) 45 min   Equipment Utilized During Treatment Gait belt   Activity Tolerance Patient tolerated treatment well   Behavior During Therapy [facility] for tasks assessed/performed      Past Medical History  Diagnosis Date  . Parkinson's disease ([place])   . Enlarged prostate   . Hyperlipidemia   . Hypertension     Past Surgical History  Procedure Laterality Date  . Tonsillectomy      There were no vitals filed for this visit.  Visit Diagnosis:  Postural instability  Posture abnormality  Abnormality of gait      Subjective Assessment - 03/22/15 1444    Subjective The patient reports doing HEP.  He is a little tired this afternoon.   Patient Stated Goals Pt's goal is to help with gait-wife wants to help with shuffling gait, to stop sliding feet along.   Currently in Pain? No/denies            Jacob Tran PT Assessment - 03/22/15 1502    Standardized Balance Assessment   Standardized Balance Assessment Timed Up and Go Test;Dynamic Gait Index   Dynamic Gait Index   Level Surface Mild Impairment   Change in Gait Speed Mild Impairment   Gait with Horizontal Head Turns Mild Impairment   Gait with Vertical Head Turns Moderate Impairment   Gait and Pivot Turn Mild Impairment   Step Over Obstacle Mild Impairment   Step Around Obstacles Moderate Impairment   Steps Mild Impairment   Total Score 14   DGI comment: 14/24   Timed Up and Go Test   TUG --  12.4 seconds without a device      Gait: DGI=14/24 TUG=12.4 seconds Gait x 230 feet x 2 reps emphasizing arm swing, upright posture and verbal cues for "slower, longer steps" Gait working on larger "U" turns and then decreasing arc of turn to work on turns during dynamic standing tasks.  NEUROMUSCULAR RE-EDUCATION: Emphasized lateral weight shifting R<>L with reaching beginning with minimal lateral shift and tactile cues to facilitate larger weight shift to lift contralateral LE>transitioned into 180 degree turns, then 360 degree turns.  Worked on t/o session with random practice (after  Initial blocked practice session) to improve carryover.  Sit<>stand emphasizing power up positioning and posture x 10 reps from multiple surfaces.        PT Long Term Goals - 03/22/15 1458    PT LONG TERM GOAL #1   Title Pt will perform HEP with family's assistance for improved posture, balance, gait.  TARGET 03/21/15   Time 4   Period Weeks   Status Achieved   PT LONG TERM GOAL #2   Title Pt will improve Timed Up and Go test to less than or equal to 13.5 seconds for decreased fall risk.   Baseline Scores 12.4 seconds   Time 4   Period Weeks   Status Achieved   PT LONG TERM  GOAL #3   Title Pt will improve Dynamic Gait Index to at least 15/24 for decreased fall risk.  MODIFIED TARGET DATE 04/20/2015   Baseline Improved to 12/24 to 14/24.   Time 4   Period Weeks   Status Partially Met   PT LONG TERM GOAL #4   Title Pt will tolerate at least 5 minutes of walking activities without loss of balance due to increased forward flexed posture and shuffling gait.  MODIFIED TARGET DATE 04/20/2015   Time 4   Period Weeks   Status On-going   PT LONG TERM GOAL #5   Title Pt/wife will verbalize understanidng of fall prevention within the home environment.  MODIFIED TARGET DATE 04/20/2015   Time 4   Period Weeks   Status On-going   PT LONG TERM GOAL #6   Title Pt/wife will  verbalize understanding of local Parkinson's disease related resources, including community fitness options. MODIFIED TARGET DATE 04/20/2015   Time 4   Period Weeks   Status On-going   PT LONG TERM GOAL #7   Title Pt will perform at least 8 of 10 reps of sit<>stand transfers with minimal to no UE support from 18 inch surfaces and lower, for improved efficiency and safety with transfers.   Baseline Patient is able to perform 8/10 times.  He uses minimal UE support and when fatigued begins to lean with LEs against mat creating a further posterior lean.   Time 4   Period Weeks   Status Achieved               Plan - 03/22/15 1512    Clinical Impression Statement The patient partially met LTGs.  Continue unmet LTGs x 4 more weeks as patient is now participating in HEP, improving in trunk/posture during standing and demonstrating some ability to carryover tasks between sessions demonstrating motor learning.    Pt will benefit from skilled therapeutic intervention in order to improve on the following deficits Abnormal gait;Decreased activity tolerance;Decreased balance;Decreased mobility;Decreased endurance;Decreased safety awareness;Decreased strength;Difficulty walking;Postural dysfunction   Rehab Potential Good   PT Frequency 2x / week   PT Duration 4 weeks   PT Treatment/Interventions ADLs/Self Care Home Management;Therapeutic exercise;Therapeutic activities;Functional mobility training;Gait training;Balance training;Neuromuscular re-education;Patient/family education   PT Next Visit Plan Review HEP, pwr in modified quadriped position   Consulted and Agree with Plan of Care Patient;Family member/caregiver   Family Member Consulted wife        Problem List Patient Active Problem List   Diagnosis Date Noted  . Parkinson's disease dementia ([place]) 09/17/2014  . BPH (benign prostatic hyperplasia) 09/17/2014  . Parkinson's disease ([place]) 09/17/2014    Jacob Tran, PT 03/22/2015,  4:30 PM  [place] [address] [place], [place], [address] Phone: [phone]   Fax:  [phone]  Name: Jacob Tran MRN: [id number] Date of Birth: [birth year]

## 2015-03-22 NOTE — Therapy (Addendum)
[facility] [address] [place], [place], [address] Phone: [phone]   Fax:  [phone]  Occupational Therapy Treatment  Patient Details  Name: Jacob Tran MRN: [id number] Date of Birth: 1933/07/12 Referring Provider: Dr. Lurena Joiner Tat  Encounter Date: 03/22/2015      OT End of Session - 03/22/15 1554    Visit Number 5   Number of Visits 17   Date for OT Re-Evaluation 04/19/15   Authorization Type Medicare/BCBS, need G code   Authorization - Visit Number 5   Authorization - Number of Visits 10   OT Start Time 1321   OT Stop Time 1400   OT Time Calculation (min) 39 min      Past Medical History  Diagnosis Date  . Parkinson's disease (HCC)   . Enlarged prostate   . Hyperlipidemia   . Hypertension     Past Surgical History  Procedure Laterality Date  . Tonsillectomy      There were no vitals filed for this visit.  Visit Diagnosis:  Cognitive deficits  Rigidity  Decreased coordination  Bradykinesia  Posture abnormality      Reviewed coordination HEP, pt requires min-mod v.c. For performance/ big movments, pt's wife verbalizes understanding. Therapist checked progress towards short term goals. Seated reaching to remove large pegs from vertical pegboard and placing in container on opposite side for trunk rotation/ upright posture, min-mod v.c.  For larger amplitude movements and upright posture.                      OT Education - 03/22/15 1553    Education provided Yes   Education Details coordination exercise review, ways to prevent future PD related complications, and POP community support group   Person(s) Educated Patient;Spouse   Methods Explanation;Demonstration;Verbal cues;Handout   Comprehension Verbalized understanding;Returned demonstration          OT Short Term Goals - 03/22/15 1337    OT SHORT TERM GOAL #1   Title Pt/wife will be independent with  PD-specific HEP.--check STGs 03/20/15   Status Achieved   OT SHORT TERM GOAL #2   Title Pt/wife will verbalize understanding of ways to prevent future complications and appropriate community resources.   Status Achieved   OT SHORT TERM GOAL #3   Title Pt will improve coordination for ADLs as shown by improving time on 9-hole peg test by at least 4sec bilaterally.   Status Achieved  LUE 40.31, RUE 34.31           OT Long Term Goals - 02/19/15 2056    OT LONG TERM GOAL #1   Title Pt/wife will verbalize understanding of AE/strategies to incr ease with ADLs prn.--check LTGs 04/19/15   Time 8   Period Weeks   Status New   OT LONG TERM GOAL #2   Title Pt will improve functional reaching for ADLs as shown by improving score on box and blocks test by at least 5 with LUE.   Baseline 30 blocks   Period Weeks   Status New   OT LONG TERM GOAL #3   Title Pt will improve coordination for ADLs as shown by improving time on 9-hole peg test by at least 8 sec with RUE.   Baseline 50.75sec   Period Weeks   Status New   OT LONG TERM GOAL #4   Title Pt will be able to fasten/unfasten 3 buttons on table by at least 4sec.   Baseline 42.0sec  Time 8   Period Weeks   Status New   OT LONG TERM GOAL #5   Title Pt will demo decr bradykinesia as shown by demo full R elbow extension with overhead reaching at least 90% of the time.   Time 8   Period Weeks   Status New               Plan - 03/22/15 1322    Clinical Impression Statement Pt is progressing towards goals. He requires reinforcement due to cognitive deficits and decreased intiation   Pt will benefit from skilled therapeutic intervention in order to improve on the following deficits (Retired) Decreased cognition;Decreased mobility;Decreased strength;Impaired UE functional use;Decreased knowledge of use of DME;Decreased balance;Decreased activity tolerance;Impaired tone;Decreased coordination;Decreased range of motion   Rehab Potential  Good   Clinical Impairments Affecting Rehab Potential PD dementia/cognitive deficits, decr initiation   OT Frequency 2x / week   OT Duration 8 weeks   OT Treatment/Interventions Self-care/ADL training;Therapeutic exercise;Functional Mobility Training;Patient/family education;Balance training;Splinting;Manual Therapy;Neuromuscular education;Ultrasound;Energy conservation;Therapeutic exercises;Therapeutic activities;DME and/or AE instruction;Cryotherapy;Electrical Stimulation;Fluidtherapy;Cognitive remediation/compensation;Visual/perceptual remediation/compensation;Passive range of motion;Contrast Bath;Moist Heat   Plan reinforce PWR! moves seated, large amplitude movements with ADLS(?bag ex)   Consulted and Agree with Plan of Care Patient;Family member/caregiver        Problem List Patient Active Problem List   Diagnosis Date Noted  . Parkinson's disease dementia (HCC) 09/17/2014  . BPH (benign prostatic hyperplasia) 09/17/2014  . Parkinson's disease (HCC) 09/17/2014    Melicia Esqueda 03/22/2015, 4:08 PM Keene BreathKathryn Rishon Thilges, OTR/L [phone] Phone: [phone] 4:08 PM 03/22/2015 [facility] [address] [place], [place], [address] Phone: [phone]   Fax:  [phone]  Name: Jacob FuJohn Tran MRN: [id number] Date of Birth: 12/26/1933

## 2015-03-22 NOTE — Addendum Note (Signed)
Addended by: Margretta DittyWEAVER, Ariyannah Pauling M on: 03/22/2015 04:41 PM   Modules accepted: Orders

## 2015-03-26 ENCOUNTER — Encounter: Payer: Medicare Other | Admitting: Occupational Therapy

## 2015-03-26 ENCOUNTER — Ambulatory Visit: Payer: Medicare Other | Admitting: Rehabilitative and Restorative Service Providers"

## 2015-03-27 ENCOUNTER — Ambulatory Visit: Payer: Medicare Other

## 2015-03-27 ENCOUNTER — Ambulatory Visit: Payer: Medicare Other | Admitting: Occupational Therapy

## 2015-03-27 DIAGNOSIS — R269 Unspecified abnormalities of gait and mobility: Secondary | ICD-10-CM | POA: Diagnosis not present

## 2015-03-27 DIAGNOSIS — R278 Other lack of coordination: Secondary | ICD-10-CM

## 2015-03-27 DIAGNOSIS — R258 Other abnormal involuntary movements: Secondary | ICD-10-CM

## 2015-03-27 DIAGNOSIS — R4189 Other symptoms and signs involving cognitive functions and awareness: Secondary | ICD-10-CM

## 2015-03-27 DIAGNOSIS — R471 Dysarthria and anarthria: Secondary | ICD-10-CM

## 2015-03-27 DIAGNOSIS — R279 Unspecified lack of coordination: Secondary | ICD-10-CM | POA: Diagnosis not present

## 2015-03-27 DIAGNOSIS — R29898 Other symptoms and signs involving the musculoskeletal system: Secondary | ICD-10-CM | POA: Diagnosis not present

## 2015-03-27 DIAGNOSIS — R293 Abnormal posture: Secondary | ICD-10-CM | POA: Diagnosis not present

## 2015-03-27 DIAGNOSIS — R41841 Cognitive communication deficit: Secondary | ICD-10-CM

## 2015-03-27 NOTE — Therapy (Signed)
[facility] [address] [place], [place], [address] Phone: [phone]   Fax:  [phone]  Speech Language Pathology Treatment  Patient Details  Name: Jacob Tran MRN: [id number] Date of Birth: [birth year] Referring Provider: Rozelle Logan Tat  Encounter Date: 03/27/2015      End of Session - 03/27/15 1427    Visit Number 8   Number of Visits 17   Date for SLP Re-Evaluation 04/16/15      Past Medical History  Diagnosis Date  . Parkinson's disease ([place])   . Enlarged prostate   . Hyperlipidemia   . Hypertension     Past Surgical History  Procedure Laterality Date  . Tonsillectomy      There were no vitals filed for this visit.  Visit Diagnosis: Dysarthria  Cognitive communication deficit      Subjective Assessment - 03/27/15 1404    Subjective Did not greet SLP.   Patient is accompained by: --  alone   Currently in Pain? No/denies               ADULT SLP TREATMENT - 03/27/15 1404    General Information   Behavior/Cognition Cooperative;Pleasant mood;Confused;Requires cueing   Treatment Provided   Treatment provided Cognitive-Linquistic   Cognitive-Linquistic Treatment   Treatment focused on Dysarthria   Skilled Treatment Pt unsure if he was completing loud /a/ at home until SLP demonstrated. Pt then said, "She does (have me do the loud /a/)."  Pt recalibrated his loudnes i nconversation by producing average 86dB on loud /a/ with min A usually. In structured sentence tasks pt maintained WNL volume 85% of the time with rare min A. Pt thought he was generally louder (wife asking him to repeat less) than prior to therapy initiation. In simple conversation SLP req'd to guide conversation 100%.    Assessment / Recommendations / Plan   Plan Continue with current plan of care   Progression Toward Goals   Progression toward goals Progressing toward goals            SLP Short Term Goals - 03/27/15  1641    SLP SHORT TERM GOAL #1   Title Pt. will demonstrate loud /a/ average of 86dB over 3 sessions.   Time 1   Period Weeks   Status Not Met   SLP SHORT TERM GOAL #2   Title Pt will demonstrate average of 70 dB 85% of structured speech tasks with rare min A in three sessions   Time 1   Period Weeks   Status Not Met   SLP SHORT TERM GOAL #3   Title Pt will respond with 3-4 word utterances during structured speech tasks with occasional min A in three sessions   Time 1   Period Weeks   Status Not Met   SLP SHORT TERM GOAL #4   Title Pt will initiate social greeting in the clinic 1x over 3 sessions with rare min A.   Baseline one session 03-14-15, 03/19/15,    Time 1   Period Weeks   Status Not Met          SLP Long Term Goals - 03/27/15 1642    SLP LONG TERM GOAL #1   Title Pt will average 70dB during structured speech tasks over 10 minutes with occasional min A   Time 4   Period Weeks   Status On-going   SLP LONG TERM GOAL #2   Title Pt will request help or tool needed (ie pen, paper,  calculator etc) during structured task with rare min A.   Time 4   Period Weeks   Status On-going   SLP LONG TERM GOAL #3   Title Pt will use 3-4 word utterances in simple conversation over 5 minutes with occasional min cues over three sessions   Time 4   Period Weeks   Status Revised   SLP LONG TERM GOAL #4   Title pt will demo intelligible speech in 5 mintues conversation outside [place] room with occasional min A    Time 4   Period Weeks   Status New          Plan - 03/27/15 1639    Clinical Impression Statement Pt required usual min A for carryover of speech in structured tasks to simple SLP-led conversation, and min A occasionally to increase utterance length to 4-7 words. Continue skilled ST to maximize carryover of loudness to decr caregiver burden, and response length.    Speech Therapy Frequency 2x / week   Duration --  5 weeks   Treatment/Interventions Cognitive  reorganization;Internal/external aids;Compensatory strategies;SLP instruction and feedback;Patient/family education;Functional tasks;Multimodal communcation approach   Potential Considerations Cooperation/participation level;Medical prognosis;Severity of impairments;Ability to learn/carryover information   Consulted and Agree with Plan of Care Patient;Family member/caregiver   Family Member Consulted spouse, Izora Gala        Problem List Patient Active Problem List   Diagnosis Date Noted  . Parkinson's disease dementia ([place]) 09/17/2014  . BPH (benign prostatic hyperplasia) 09/17/2014  . Parkinson's disease ([place]) 09/17/2014    [facility] ,[place], [place]  03/27/2015, 5:00 PM  [facility] [address] [place], [place], [address] Phone: [phone]   Fax:  [phone]   Name: Jacob Tran MRN: [id number] Date of Birth: 01-26-1933

## 2015-03-27 NOTE — Patient Instructions (Signed)
STAY LOUD at home!

## 2015-03-27 NOTE — Therapy (Signed)
[facility] [address] [place], [place], [address] Phone: [phone]   Fax:  [phone]  Occupational Therapy Treatment  Patient Details  Name: Jacob Tran MRN: [id number] Date of Birth: 1933-04-14 Referring Provider: Dr. Lurena Joinerebecca Tat  Encounter Date: 03/27/2015    Past Medical History  Diagnosis Date  . Parkinson's disease (HCC)   . Enlarged prostate   . Hyperlipidemia   . Hypertension     Past Surgical History  Procedure Laterality Date  . Tonsillectomy      There were no vitals filed for this visit.  Visit Diagnosis:  Decreased coordination  Rigidity  Bradykinesia  Cognitive deficits      Subjective Assessment - 03/27/15 1326    Subjective  Denies pain   Pertinent History PD (diagnosed since 2010); Parkinson's disease dementia, hx of "back problems"   Patient Stated Goals improve speech, walking, and decr time for ADLs   Currently in Pain? No/denies       Simulated ADLs for : tucking in shirt, donning pants, pulling on socks and donning shirt with plastic bag exercises, min v.c. And demonstration. Flipping and dealing playing cards with mod v.c. For finger extension, thumb movement .                   PWR [facility] - 03/27/15 1343    PWR! exercises Moves in sitting   PWR! Up 10   PWR! Rock 10   PWR! Twist 10   PWR! Step 10   Comments min v.c./ and demonstration for large amplitude movements               OT Short Term Goals - 03/22/15 1337    OT SHORT TERM GOAL #1   Title Pt/wife will be independent with PD-specific HEP.--check STGs 03/20/15   Status Achieved   OT SHORT TERM GOAL #2   Title Pt/wife will verbalize understanding of ways to prevent future complications and appropriate community resources.   Status Achieved   OT SHORT TERM GOAL #3   Title Pt will improve coordination for ADLs as shown by improving time on 9-hole peg test by at least 4sec bilaterally.   Status Achieved  LUE 40.31, RUE 34.31           OT Long Term Goals - 02/19/15 2056    OT LONG TERM GOAL #1   Title Pt/wife will verbalize understanding of AE/strategies to incr ease with ADLs prn.--check LTGs 04/19/15   Time 8   Period Weeks   Status New   OT LONG TERM GOAL #2   Title Pt will improve functional reaching for ADLs as shown by improving score on box and blocks test by at least 5 with LUE.   Baseline 30 blocks   Period Weeks   Status New   OT LONG TERM GOAL #3   Title Pt will improve coordination for ADLs as shown by improving time on 9-hole peg test by at least 8 sec with RUE.   Baseline 50.75sec   Period Weeks   Status New   OT LONG TERM GOAL #4   Title Pt will be able to fasten/unfasten 3 buttons on table by at least 4sec.   Baseline 42.0sec   Time 8   Period Weeks   Status New   OT LONG TERM GOAL #5   Title Pt will demo decr bradykinesia as shown by demo full R elbow extension with overhead reaching at least 90% of the time.  Time 8   Period Weeks   Status New               Plan - 03/27/15 1344    Clinical Impression Statement pt is progressing slowly towards goals. He remains limited by rigidity and cognitive deficits and requires reinforcement as a result.   Pt will benefit from skilled therapeutic intervention in order to improve on the following deficits (Retired) Decreased cognition;Decreased mobility;Decreased strength;Impaired UE functional use;Decreased knowledge of use of DME;Decreased balance;Decreased activity tolerance;Impaired tone;Decreased coordination;Decreased range of motion   Rehab Potential Good   Clinical Impairments Affecting Rehab Potential PD dementia/cognitive deficits, decr initiation   OT Frequency 2x / week   OT Duration 8 weeks   OT Treatment/Interventions Self-care/ADL training;Therapeutic exercise;Functional Mobility Training;Patient/family education;Balance training;Splinting;Manual Therapy;Neuromuscular  education;Ultrasound;Energy conservation;Therapeutic exercises;Therapeutic activities;DME and/or AE instruction;Cryotherapy;Electrical Stimulation;Fluidtherapy;Cognitive remediation/compensation;Visual/perceptual remediation/compensation;Passive range of motion;Contrast Bath;Moist Heat   Plan dynamic reaching in setaed and standing to address abnormal posture   OT Home Exercise Plan issued PWR! supine, seated, coordination   Consulted and Agree with Plan of Care Patient        Problem List Patient Active Problem List   Diagnosis Date Noted  . Parkinson's disease dementia (HCC) 09/17/2014  . BPH (benign prostatic hyperplasia) 09/17/2014  . Parkinson's disease (HCC) 09/17/2014    Jacob Tran 03/27/2015, 1:52 PM Keene Breath, OTR/L [phone] Phone: [phone] 1:52 PM 03/27/2015 [facility] [address] [place], [place], [address] Phone: [phone]   Fax:  [phone]  Name: Jacob Tran MRN: [id number] Date of Birth: Feb 01, 1933

## 2015-03-28 ENCOUNTER — Telehealth: Payer: Self-pay | Admitting: Physician Assistant

## 2015-03-28 ENCOUNTER — Ambulatory Visit: Payer: Medicare Other | Admitting: Rehabilitative and Restorative Service Providers"

## 2015-03-28 DIAGNOSIS — R258 Other abnormal involuntary movements: Secondary | ICD-10-CM | POA: Diagnosis not present

## 2015-03-28 DIAGNOSIS — R269 Unspecified abnormalities of gait and mobility: Secondary | ICD-10-CM | POA: Diagnosis not present

## 2015-03-28 DIAGNOSIS — R29898 Other symptoms and signs involving the musculoskeletal system: Secondary | ICD-10-CM | POA: Diagnosis not present

## 2015-03-28 DIAGNOSIS — R279 Unspecified lack of coordination: Secondary | ICD-10-CM | POA: Diagnosis not present

## 2015-03-28 DIAGNOSIS — R293 Abnormal posture: Secondary | ICD-10-CM

## 2015-03-28 DIAGNOSIS — R2681 Unsteadiness on feet: Secondary | ICD-10-CM

## 2015-03-28 DIAGNOSIS — R4189 Other symptoms and signs involving cognitive functions and awareness: Secondary | ICD-10-CM | POA: Diagnosis not present

## 2015-03-28 DIAGNOSIS — R278 Other lack of coordination: Secondary | ICD-10-CM

## 2015-03-28 MED ORDER — SILODOSIN 4 MG PO CAPS: ORAL_CAPSULE | ORAL | Status: DC

## 2015-03-28 NOTE — Telephone Encounter (Signed)
Caller name:Lienemann,Nancy Relation to WU:[id number]:spouse  Call back [phone] [facility] [place] - [id number]  Reason for call:  Patient requesting a refill RAPAFLO 4 MG CAPS capsule and donepezil (ARICEPT) 10 MG tablet

## 2015-03-28 NOTE — Telephone Encounter (Signed)
Spoke to the wife and she thinks Dr. Arbutus Leasat has not every prescribed anything.  I did inform her name was listed by this medication.  Informed to call her office for refills  She agreed to do so.

## 2015-03-28 NOTE — Telephone Encounter (Signed)
Ok to send in Flomax. The Aricept comes from Dr. Arbutus Leasat so they need to call her office for refills.

## 2015-03-28 NOTE — Telephone Encounter (Signed)
Spouse returned call and stated the donepezil (ARICEPT) 10 MG tablet was orignally prescribed from [place] doctor in [place] and would like a call back best # [phone]

## 2015-03-28 NOTE — Telephone Encounter (Signed)
Called cell number left message to call back.

## 2015-03-28 NOTE — Telephone Encounter (Signed)
Advise on refill of Aricept as you did not previously prescibe.

## 2015-03-28 NOTE — Therapy (Signed)
[facility] [address] [place], [place], [address] Phone: [phone]   Fax:  [phone]  Physical Therapy Treatment  Patient Details  Name: Jacob Tran MRN: [id number] Date of Birth: [birth year] Referring Provider: Alonza Bogus  Encounter Date: 03/28/2015      PT End of Session - 03/28/15 1423    Visit Number 10   Number of Visits 17   Date for PT Re-Evaluation 04/20/15   Authorization Type Medicare/BCBS-GCODE every 10th visit   PT Start Time 1317   PT Stop Time 1403   PT Time Calculation (min) 46 min   Equipment Utilized During Treatment Gait belt   Activity Tolerance Patient tolerated treatment well   Behavior During Therapy [facility] for tasks assessed/performed      Past Medical History  Diagnosis Date  . Parkinson's disease ([place])   . Enlarged prostate   . Hyperlipidemia   . Hypertension     Past Surgical History  Procedure Laterality Date  . Tonsillectomy      There were no vitals filed for this visit.  Visit Diagnosis:  Abnormality of gait  Unsteadiness  Decreased coordination  Posture abnormality      Subjective Assessment - 03/28/15 1320    Subjective The patient is doing exercises in the home.  His wife was sick yesterday and he did not exercise.     Patient Stated Goals Pt's goal is to help with gait-wife wants to help with shuffling gait, to stop sliding feet along.   Currently in Pain? No/denies      NEUROMUSCULAR RE-EDUCATION: Standing modified quadriped PWR! Exercises working on trunk flexion to extension with UE weight bearing through countertop x 5 reps Standing trunk rotation opening wide with UE/trunk motion looking towards hand x 5 reps to each side> progressing to stepping posteriorly with movement in order to increase weight shift. Seated on physioball with posture re-ed working on reaching wide for scapular retraction, and adding resistance for scapular retraction x 5  reps Standing reaching wide with a lateral step with targets for visual cue x 5 reps to each side. Seated trunk elongation with reaching for posture re-education Standing reaching activities towards tall target (top of doorframe) with min A for balance and cues to keep foot in contact with the floor  Gait: Ambulation without a device 345 feet x 2 reps, then 115 feet x 2 reps with cues on longer stride length with slower speed, decreasing UE bracing behind back.  PT for cues on chest upright and hip positioning.  THERAPEUTIC EXERCISE: Supine P/ROM hamstring stretching x R and L x 2 reps each, supine lumbar rocking, wide leg PWR marching in supine with scapular retraction cues x 5 reps to each side. Sit<>stand for PWR up positioning x 8 reps       PT Long Term Goals - 03/22/15 1458    PT LONG TERM GOAL #1   Title Pt will perform HEP with family's assistance for improved posture, balance, gait.  TARGET 03/21/15   Time 4   Period Weeks   Status Achieved   PT LONG TERM GOAL #2   Title Pt will improve Timed Up and Go test to less than or equal to 13.5 seconds for decreased fall risk.   Baseline Scores 12.4 seconds   Time 4   Period Weeks   Status Achieved   PT LONG TERM GOAL #3   Title Pt will improve Dynamic Gait Index to at least 15/24 for decreased fall risk.  MODIFIED TARGET DATE 04/20/2015   Baseline Improved to 12/24 to 14/24.   Time 4   Period Weeks   Status Partially Met   PT LONG TERM GOAL #4   Title Pt will tolerate at least 5 minutes of walking activities without loss of balance due to increased forward flexed posture and shuffling gait.  MODIFIED TARGET DATE 04/20/2015   Time 4   Period Weeks   Status On-going   PT LONG TERM GOAL #5   Title Pt/wife will verbalize understanidng of fall prevention within the home environment.  MODIFIED TARGET DATE 04/20/2015   Time 4   Period Weeks   Status On-going   PT LONG TERM GOAL #6   Title Pt/wife will verbalize understanding of local  Parkinson's disease related resources, including community fitness options. MODIFIED TARGET DATE 04/20/2015   Time 4   Period Weeks   Status On-going   PT LONG TERM GOAL #7   Title Pt will perform at least 8 of 10 reps of sit<>stand transfers with minimal to no UE support from 18 inch surfaces and lower, for improved efficiency and safety with transfers.   Baseline Patient is able to perform 8/10 times.  He uses minimal UE support and when fatigued begins to lean with LEs against mat creating a further posterior lean.   Time 4   Period Weeks   Status Achieved               Plan - 2015-04-08 1426    Clinical Impression Statement The patient is demonstrating longer stride length and improved posture with carryover between sessions.  PT to continue progressing to newly established LTGs and to continue repetition of movement in order to improve carryover of postural upright and longer stride to home.   Pt will benefit from skilled therapeutic intervention in order to improve on the following deficits Abnormal gait;Decreased activity tolerance;Decreased balance;Decreased mobility;Decreased endurance;Decreased safety awareness;Decreased strength;Difficulty walking;Postural dysfunction   PT Frequency 2x / week   PT Duration 4 weeks   PT Treatment/Interventions ADLs/Self Care Home Management;Therapeutic exercise;Therapeutic activities;Functional mobility training;Gait training;Balance training;Neuromuscular re-education;Patient/family education   PT Next Visit Plan Review HEP, pwr in modified quadriped position   Consulted and Agree with Plan of Care Patient;Family member/caregiver   Family Member Consulted wife          G-Codes - 04-08-15 1424    Functional Assessment Tool Used DGI=14/24, TUG=12.4 seconds   Functional Limitation Mobility: Walking and moving around   Mobility: Walking and Moving Around Current Status [phone]) At least 40 percent but less than 60 percent impaired, limited or  restricted   Mobility: Walking and Moving Around Goal Status [phone]) At least 20 percent but less than 40 percent impaired, limited or restricted      Problem List Patient Active Problem List   Diagnosis Date Noted  . Parkinson's disease dementia ([place]) 09/17/2014  . BPH (benign prostatic hyperplasia) 09/17/2014  . Parkinson's disease ([place]) 09/17/2014    Physical Therapy Progress Note  Dates of Reporting Period: 02/19/2015 to April 08, 2015  Objective Reports of Subjective Statement:  Patient's family notes improved upright sitting posture, improved awareness of posture during gait.  Objective Measurements: DGI improved from 12/24 up to 14/24 TUG improved from 14.66 seconds to 12.4 seconds   Goal Update: see above  Plan: see above  Reason Skilled Services are Required: patient carrying over exercises to home with caregiver assistance.    [place], PT 04/08/15, 2:28 PM  [facility]  [address] [place], [place], [address] Phone: [phone]   Fax:  [phone]  Name: Jacob Tran MRN: [id number] Date of Birth: Feb 03, 1933

## 2015-03-29 ENCOUNTER — Telehealth: Payer: Self-pay | Admitting: Neurology

## 2015-03-29 ENCOUNTER — Ambulatory Visit: Payer: Medicare Other

## 2015-03-29 ENCOUNTER — Ambulatory Visit: Payer: Medicare Other | Admitting: Occupational Therapy

## 2015-03-29 DIAGNOSIS — R29898 Other symptoms and signs involving the musculoskeletal system: Secondary | ICD-10-CM

## 2015-03-29 DIAGNOSIS — R41841 Cognitive communication deficit: Secondary | ICD-10-CM

## 2015-03-29 DIAGNOSIS — R471 Dysarthria and anarthria: Secondary | ICD-10-CM

## 2015-03-29 DIAGNOSIS — R4189 Other symptoms and signs involving cognitive functions and awareness: Secondary | ICD-10-CM

## 2015-03-29 DIAGNOSIS — R278 Other lack of coordination: Secondary | ICD-10-CM

## 2015-03-29 DIAGNOSIS — R2681 Unsteadiness on feet: Secondary | ICD-10-CM

## 2015-03-29 DIAGNOSIS — R258 Other abnormal involuntary movements: Secondary | ICD-10-CM

## 2015-03-29 DIAGNOSIS — R269 Unspecified abnormalities of gait and mobility: Secondary | ICD-10-CM | POA: Diagnosis not present

## 2015-03-29 DIAGNOSIS — R279 Unspecified lack of coordination: Secondary | ICD-10-CM

## 2015-03-29 DIAGNOSIS — R293 Abnormal posture: Secondary | ICD-10-CM

## 2015-03-29 MED ORDER — DONEPEZIL HCL 10 MG PO TABS: 10.0000 mg | ORAL_TABLET | Freq: Every day | ORAL | Status: DC

## 2015-03-29 NOTE — Patient Instructions (Signed)
  Please complete the assigned speech therapy homework and return it to your next session.  

## 2015-03-29 NOTE — Therapy (Signed)
[facility] [address] [place], [place], [address] Phone: [phone]   Fax:  [phone]  Speech Language Pathology Treatment  Patient Details  Name: Jacob Tran MRN: [id number] Date of Birth: September 02, 1933 Referring Provider: Rozelle Logan Tat  Encounter Date: 03/29/2015      End of Session - 03/29/15 1652    Visit Number 9   Number of Visits 17   Date for SLP Re-Evaluation 04/16/15   SLP Start Time 1448   SLP Stop Time  1530   SLP Time Calculation (min) 42 min   Activity Tolerance Patient tolerated treatment well      Past Medical History  Diagnosis Date  . Parkinson's disease ([place])   . Enlarged prostate   . Hyperlipidemia   . Hypertension     Past Surgical History  Procedure Laterality Date  . Tonsillectomy      There were no vitals filed for this visit.  Visit Diagnosis: Dysarthria  Cognitive communication deficit      Subjective Assessment - 03/29/15 1450    Subjective Did not greet SLP spontaneously.   Patient is accompained by: Family member  wife   Currently in Pain? No/denies               ADULT SLP TREATMENT - 03/29/15 1453    General Information   Behavior/Cognition Cooperative;Pleasant mood;Confused;Requires cueing   Patient Positioning Upright in bed   Treatment Provided   Treatment provided Cognitive-Linquistic   Cognitive-Linquistic Treatment   Treatment focused on Dysarthria   Skilled Treatment  Pt did not greet SLP spontaneously, but req'd verbal cues to do so. Pt maintained loud /a/ at average 83dB today in order to habitualize muscle memory for louder speech in conversation. In strucutred tasks pt responded with 70dB loudness with rare min A but SLP needed to cue pt to use >1-2 word responses. SLP facilitated tactile recognition of louder speech by having pt put his hand on his abdominal musculature - this took pt 4-5 reps before he realized the abdominal movement  coorelating to louder speech.    Assessment / Recommendations / Plan   Plan Continue with current plan of care   Progression Toward Goals   Progression toward goals Progressing toward goals          SLP Education - 03/29/15 1652    Education provided Yes   Education Details abdominal muscle use for louder speech   Person(s) Educated Patient   Methods Explanation;Tactile cues   Comprehension Verbalized understanding;Tactile cues required;Need further instruction          SLP Short Term Goals - 03/27/15 1641    SLP SHORT TERM GOAL #1   Title Pt. will demonstrate loud /a/ average of 86dB over 3 sessions.   Time 1   Period Weeks   Status Not Met   SLP SHORT TERM GOAL #2   Title Pt will demonstrate average of 70 dB 85% of structured speech tasks with rare min A in three sessions   Time 1   Period Weeks   Status Not Met   SLP SHORT TERM GOAL #3   Title Pt will respond with 3-4 word utterances during structured speech tasks with occasional min A in three sessions   Time 1   Period Weeks   Status Not Met   SLP SHORT TERM GOAL #4   Title Pt will initiate social greeting in the clinic 1x over 3 sessions with rare min A.   Baseline one session  03-14-15, 03/19/15,    Time 1   Period Weeks   Status Not Met          SLP Long Term Goals - 03/29/15 1654    SLP LONG TERM GOAL #1   Title Pt will average 70dB during structured speech tasks over 10 minutes with occasional min A   Time 4   Period Weeks   Status On-going   SLP LONG TERM GOAL #2   Title Pt will request help or tool needed (ie pen, paper, calculator etc) during structured task with rare min A.   Time 4   Period Weeks   Status On-going   SLP LONG TERM GOAL #3   Title Pt will use 3-4 word utterances in simple conversation over 5 minutes with occasional min cues over three sessions   Time 4   Period Weeks   Status Revised   SLP LONG TERM GOAL #4   Title pt will demo intelligible speech in 5 mintues conversation  outside [place] room with occasional min A    Time 4   Period Weeks   Status New          Plan - 03/29/15 1652    Clinical Impression Statement Pt required  A from SLP  to increase utterance length >1-2 words. Pt was better in structured tasks wth keeping loudness at appropriate levels. Continue skilled ST to maximize carryover of loudness to decr caregiver burden, and response length.    Speech Therapy Frequency 2x / week   Duration 4 weeks   Treatment/Interventions Cognitive reorganization;Internal/external aids;Compensatory strategies;SLP instruction and feedback;Patient/family education;Functional tasks;Multimodal communcation approach   Potential to Achieve Goals Fair   Potential Considerations Cooperation/participation level;Medical prognosis;Severity of impairments;Ability to learn/carryover information        Problem List Patient Active Problem List   Diagnosis Date Noted  . Parkinson's disease dementia ([place]) 09/17/2014  . BPH (benign prostatic hyperplasia) 09/17/2014  . Parkinson's disease ([place]) 09/17/2014    [facility] ,[place], [place]   03/29/2015, 4:55 PM  [facility] [address] [place], [place], [address] Phone: [phone]   Fax:  [phone]   Name: Jacob Tran MRN: [id number] Date of Birth: Feb 28, 1933

## 2015-03-29 NOTE — Telephone Encounter (Signed)
Patient's wife made aware per last office note patient to stay on medication. RX sent to pharmacy.

## 2015-03-29 NOTE — Telephone Encounter (Signed)
Pat.'s wife called and would like to know if Dr. Arbutus Leasat wants her husband to stay on Donepezil.  If so please call to [facility].  And let her know if he is to continue taking This med.

## 2015-03-30 NOTE — Therapy (Signed)
[facility] [address] [place], [place], [address] Phone: [phone]   Fax:  [phone]  Occupational Therapy Treatment  Patient Details  Name: Jacob Tran MRN: [id number] Date of Birth: [birth year] Referring Provider: Dr. Lurena Joiner Tat  Encounter Date: 03/29/2015      OT End of Session - 03/30/15 1217    Visit Number 7   Number of Visits 17   Date for OT Re-Evaluation 04/19/15   Authorization Type Medicare/BCBS, need G code   Authorization - Visit Number 7   Authorization - Number of Visits 10   OT Start Time 1404   OT Stop Time 1445   OT Time Calculation (min) 41 min   Activity Tolerance Patient tolerated treatment well   Behavior During Therapy Flat affect      Past Medical History  Diagnosis Date  . Parkinson's disease ([facility])   . Enlarged prostate   . Hyperlipidemia   . Hypertension     Past Surgical History  Procedure Laterality Date  . Tonsillectomy      There were no vitals filed for this visit.  Visit Diagnosis:  Unsteadiness  Decreased coordination  Posture abnormality  Rigidity  Bradykinesia  Cognitive deficits      Subjective Assessment - 03/30/15 1213    Subjective  Denies pain   Patient is accompained by: Family member   Pertinent History PD (diagnosed since 2010); Parkinson's disease dementia, hx of "back problems"   Limitations PD dementia   Patient Stated Goals improve speech, walking, and decr time for ADLs   Currently in Pain? No/denies       Treatment:Pt practiced fastening/ unfastening buttons with PWR! Hands and adapted technique, increased time mod. V.c., pt demonstrates improved performance fastening the buttons he is wearing. Arm bike x 5 mins for conditioning, pt reurired v.c. For speed and maintained 20-30 RPM Dynamic step and reach with trunk rotation with bilateral UE's to place items on shelf, mod v.c. For posture and large amplitude  movements.                          OT Short Term Goals - 03/22/15 1337    OT SHORT TERM GOAL #1   Title Pt/wife will be independent with PD-specific HEP.--check STGs 03/20/15   Status Achieved   OT SHORT TERM GOAL #2   Title Pt/wife will verbalize understanding of ways to prevent future complications and appropriate community resources.   Status Achieved   OT SHORT TERM GOAL #3   Title Pt will improve coordination for ADLs as shown by improving time on 9-hole peg test by at least 4sec bilaterally.   Status Achieved  LUE 40.31, RUE 34.31           OT Long Term Goals - 02/19/15 2056    OT LONG TERM GOAL #1   Title Pt/wife will verbalize understanding of AE/strategies to incr ease with ADLs prn.--check LTGs 04/19/15   Time 8   Period Weeks   Status New   OT LONG TERM GOAL #2   Title Pt will improve functional reaching for ADLs as shown by improving score on box and blocks test by at least 5 with LUE.   Baseline 30 blocks   Period Weeks   Status New   OT LONG TERM GOAL #3   Title Pt will improve coordination for ADLs as shown by improving time on 9-hole peg test by at least 8 sec with RUE.  Baseline 50.75sec   Period Weeks   Status New   OT LONG TERM GOAL #4   Title Pt will be able to fasten/unfasten 3 buttons on table by at least 4sec.   Baseline 42.0sec   Time 8   Period Weeks   Status New   OT LONG TERM GOAL #5   Title Pt will demo decr bradykinesia as shown by demo full R elbow extension with overhead reaching at least 90% of the time.   Time 8   Period Weeks   Status New               Plan - 03/30/15 1214    Clinical Impression Statement Pt is progressing towards goals slowly. He benefits from repetition and cueing due to cognitive deficits.   Pt will benefit from skilled therapeutic intervention in order to improve on the following deficits (Retired) Decreased cognition;Decreased mobility;Decreased strength;Impaired UE functional  use;Decreased knowledge of use of DME;Decreased balance;Decreased activity tolerance;Impaired tone;Decreased coordination;Decreased range of motion   Rehab Potential Good   Clinical Impairments Affecting Rehab Potential PD dementia/cognitive deficits, decr initiation   OT Frequency 2x / week   OT Duration 8 weeks   OT Treatment/Interventions Self-care/ADL training;Therapeutic exercise;Functional Mobility Training;Patient/family education;Balance training;Splinting;Manual Therapy;Neuromuscular education;Ultrasound;Energy conservation;Therapeutic exercises;Therapeutic activities;DME and/or AE instruction;Cryotherapy;Electrical Stimulation;Fluidtherapy;Cognitive remediation/compensation;Visual/perceptual remediation/compensation;Passive range of motion;Contrast Bath;Moist Heat   Plan dynamic reaching activities to address posture in prep for ADLS, practice donning/ doffing jacket with adapted technique   OT Home Exercise Plan issued PWR! supine, seated, coordination   Consulted and Agree with Plan of Care Patient        Problem List Patient Active Problem List   Diagnosis Date Noted  . Parkinson's disease dementia ([facility]) 09/17/2014  . BPH (benign prostatic hyperplasia) 09/17/2014  . Parkinson's disease ([facility]) 09/17/2014    RINE,KATHRYN 03/30/2015, 12:18 PM  [facility] [address] [place], [place], [address] Phone: [phone]   Fax:  [phone]  Name: Jacob Tran MRN: [id number] Date of Birth: 1933-03-28

## 2015-04-01 ENCOUNTER — Ambulatory Visit: Payer: Medicare Other | Admitting: Occupational Therapy

## 2015-04-01 DIAGNOSIS — R258 Other abnormal involuntary movements: Secondary | ICD-10-CM | POA: Diagnosis not present

## 2015-04-01 DIAGNOSIS — R269 Unspecified abnormalities of gait and mobility: Secondary | ICD-10-CM | POA: Diagnosis not present

## 2015-04-01 DIAGNOSIS — R4189 Other symptoms and signs involving cognitive functions and awareness: Secondary | ICD-10-CM | POA: Diagnosis not present

## 2015-04-01 DIAGNOSIS — R279 Unspecified lack of coordination: Secondary | ICD-10-CM | POA: Diagnosis not present

## 2015-04-01 DIAGNOSIS — R29898 Other symptoms and signs involving the musculoskeletal system: Secondary | ICD-10-CM | POA: Diagnosis not present

## 2015-04-01 DIAGNOSIS — R293 Abnormal posture: Secondary | ICD-10-CM

## 2015-04-01 DIAGNOSIS — R278 Other lack of coordination: Secondary | ICD-10-CM

## 2015-04-01 NOTE — Therapy (Signed)
[facility] [address] [place], [place], [address] Phone: [phone]   Fax:  [phone]  Occupational Therapy Treatment  Patient Details  Name: Jacob Tran MRN: [id number] Date of Birth: 05-Dec-1933 Referring Provider: Dr. Lurena Joiner Tat  Encounter Date: 04/01/2015      OT End of Session - 04/01/15 1406    Visit Number 8   Number of Visits 17   Date for OT Re-Evaluation 04/19/15   Authorization Type Medicare/BCBS, need G code   Authorization - Visit Number 8   Authorization - Number of Visits 10   OT Start Time 1404   OT Stop Time 1445   OT Time Calculation (min) 41 min   Activity Tolerance Patient tolerated treatment well   Behavior During Therapy Flat affect      Past Medical History  Diagnosis Date  . Parkinson's disease ([facility])   . Enlarged prostate   . Hyperlipidemia   . Hypertension     Past Surgical History  Procedure Laterality Date  . Tonsillectomy      There were no vitals filed for this visit.  Visit Diagnosis:  Bradykinesia  Decreased coordination  Posture abnormality  Rigidity  Cognitive deficits      Subjective Assessment - 04/01/15 1731    Subjective  Pt able to report daughter's names   Patient is accompained by: Family member   Pertinent History PD (diagnosed since 2010); Parkinson's disease dementia, hx of "back problems"   Limitations PD dementia   Patient Stated Goals improve speech, walking, and decr time for ADLs   Currently in Pain? No/denies                      OT Treatments/Exercises (OP) - 04/01/15 0001    ADLs   UB Dressing Practiced donning/doffing sweater using big amplitude movement strategies with diffiuclty.  Attempted x3 but pt would benefit from further repetition and trial.   Functional Mobility Functional sit>stand with focus on large amplitude movements.  Wife instructed in cueing to help with scooting forward (and side to side wt.  shift), strategies for sit>stand including forward reach for improved wt. shift.   ADL Comments discussed progress.                OT Education - 04/01/15 1732    Education Details compensation strategies for memory; use of daily schedule (picture, written, on Kindle) to help with initiation/memory; use of timers (and different types to help with remembering medications); use of memory notebook/photos on Kindle, etc to help with STM and LTM; need for cueing and how to cue; recommended reviewing family info/events and asking pt questions to incr engagement and promote thinking skills   Person(s) Educated Patient;Spouse   Methods Explanation;Verbal cues   Comprehension Verbalized understanding          OT Short Term Goals - 03/22/15 1337    OT SHORT TERM GOAL #1   Title Pt/wife will be independent with PD-specific HEP.--check STGs 03/20/15   Status Achieved   OT SHORT TERM GOAL #2   Title Pt/wife will verbalize understanding of ways to prevent future complications and appropriate community resources.   Status Achieved   OT SHORT TERM GOAL #3   Title Pt will improve coordination for ADLs as shown by improving time on 9-hole peg test by at least 4sec bilaterally.   Status Achieved  LUE 40.31, RUE 34.31           OT Long Term  Goals - 02/19/15 2056    OT LONG TERM GOAL #1   Title Pt/wife will verbalize understanding of AE/strategies to incr ease with ADLs prn.--check LTGs 04/19/15   Time 8   Period Weeks   Status New   OT LONG TERM GOAL #2   Title Pt will improve functional reaching for ADLs as shown by improving score on box and blocks test by at least 5 with LUE.   Baseline 30 blocks   Period Weeks   Status New   OT LONG TERM GOAL #3   Title Pt will improve coordination for ADLs as shown by improving time on 9-hole peg test by at least 8 sec with RUE.   Baseline 50.75sec   Period Weeks   Status New   OT LONG TERM GOAL #4   Title Pt will be able to fasten/unfasten 3  buttons on table by at least 4sec.   Baseline 42.0sec   Time 8   Period Weeks   Status New   OT LONG TERM GOAL #5   Title Pt will demo decr bradykinesia as shown by demo full R elbow extension with overhead reaching at least 90% of the time.   Time 8   Period Weeks   Status New               Plan - 04/01/15 1737    Clinical Impression Statement Pt is progressing towards goals, but demo improve posture, ambulation, affect, and initiation.  Cognitive deficits are biggest barrier, but pt responds to cueing.   Clinical Impairments Affecting Rehab Potential PD dementia/cognitive deficits, decr initiation   Plan dynamic reaching activities, practice donning/doffing jacket   OT Home Exercise Plan issued PWR! supine, seated, coordination   Consulted and Agree with Plan of Care Patient        Problem List Patient Active Problem List   Diagnosis Date Noted  . Parkinson's disease dementia ([facility]) 09/17/2014  . BPH (benign prostatic hyperplasia) 09/17/2014  . Parkinson's disease ([facility]) 09/17/2014    [facility],Oaklie Durrett 04/01/2015, 5:43 PM  [facility] [address] [place], [place], [address] Phone: [phone]   Fax:  [phone]  Name: Anne FuJohn Buckholtz MRN: [id number] Date of Birth: 1933-06-12  Willa FraterAngela Fannye Myer, OTR/L [facility] [address] [place], [place]  [address] [phone] 04/01/2015 5:43 PM

## 2015-04-03 ENCOUNTER — Ambulatory Visit: Payer: Medicare Other | Admitting: Occupational Therapy

## 2015-04-03 ENCOUNTER — Ambulatory Visit: Payer: Medicare Other | Admitting: Rehabilitative and Restorative Service Providers"

## 2015-04-03 DIAGNOSIS — R29898 Other symptoms and signs involving the musculoskeletal system: Secondary | ICD-10-CM | POA: Diagnosis not present

## 2015-04-03 DIAGNOSIS — R258 Other abnormal involuntary movements: Secondary | ICD-10-CM | POA: Diagnosis not present

## 2015-04-03 DIAGNOSIS — R278 Other lack of coordination: Secondary | ICD-10-CM

## 2015-04-03 DIAGNOSIS — R279 Unspecified lack of coordination: Secondary | ICD-10-CM | POA: Diagnosis not present

## 2015-04-03 DIAGNOSIS — R293 Abnormal posture: Secondary | ICD-10-CM

## 2015-04-03 DIAGNOSIS — R4189 Other symptoms and signs involving cognitive functions and awareness: Secondary | ICD-10-CM | POA: Diagnosis not present

## 2015-04-03 DIAGNOSIS — R269 Unspecified abnormalities of gait and mobility: Secondary | ICD-10-CM

## 2015-04-03 DIAGNOSIS — R2681 Unsteadiness on feet: Secondary | ICD-10-CM

## 2015-04-03 NOTE — Therapy (Signed)
[facility] [address] [place], [place], [address] Phone: [phone]   Fax:  [phone]  Physical Therapy Treatment  Patient Details  Name: Jacob Tran MRN: [id number] Date of Birth: [birth year] Referring Provider: Alonza Bogus  Encounter Date: 04/03/2015      PT End of Session - 04/03/15 1034    Visit Number 11   Number of Visits 17   Date for PT Re-Evaluation 04/20/15   Authorization Type Medicare/BCBS-GCODE every 10th visit   PT Start Time 1028   PT Stop Time 1108   PT Time Calculation (min) 40 min   Equipment Utilized During Treatment Gait belt   Activity Tolerance Patient tolerated treatment well   Behavior During Therapy [facility] for tasks assessed/performed      Past Medical History  Diagnosis Date  . Parkinson's disease ([place])   . Enlarged prostate   . Hyperlipidemia   . Hypertension     Past Surgical History  Procedure Laterality Date  . Tonsillectomy      There were no vitals filed for this visit.  Visit Diagnosis:  Abnormality of gait  Posture abnormality  Unsteadiness  Postural instability      Subjective Assessment - 04/03/15 1034    Subjective The patient is doing HEP.        NEUROMUSCULAR RE-EDUCATION: Pwr! Up posture sit<>stand x 8 repetitions Standing trunk rotation reaching across midline with CGA x 10 reps Posterior >midline step x 5 reps each Anterior>midline step x 5 reps each Lateral step>midline x 5 reps each direction Rock and reach working towards reaching targets for visual cues  Gait: Ambulation working towards arm swing with longer stride x 350 feet x 4 reps.  Patient needs cues for slowed pace and breathing.  SELF CARE/HOME MANAGEMENT: Patient's wife discusses compliance of patient not being able to do HEP without her help.  She discusses that she would like to return to work, however patient would not want to have a paid caregiver in the home.  PT and patient  discussed and provided resources for [employer] [facility] for caregiver assistance.       PT Long Term Goals - 03/22/15 1458    PT LONG TERM GOAL #1   Title Pt will perform HEP with family's assistance for improved posture, balance, gait.  TARGET 03/21/15   Time 4   Period Weeks   Status Achieved   PT LONG TERM GOAL #2   Title Pt will improve Timed Up and Go test to less than or equal to 13.5 seconds for decreased fall risk.   Baseline Scores 12.4 seconds   Time 4   Period Weeks   Status Achieved   PT LONG TERM GOAL #3   Title Pt will improve Dynamic Gait Index to at least 15/24 for decreased fall risk.  MODIFIED TARGET DATE 04/20/2015   Baseline Improved to 12/24 to 14/24.   Time 4   Period Weeks   Status Partially Met   PT LONG TERM GOAL #4   Title Pt will tolerate at least 5 minutes of walking activities without loss of balance due to increased forward flexed posture and shuffling gait.  MODIFIED TARGET DATE 04/20/2015   Time 4   Period Weeks   Status On-going   PT LONG TERM GOAL #5   Title Pt/wife will verbalize understanidng of fall prevention within the home environment.  MODIFIED TARGET DATE 04/20/2015   Time 4   Period Weeks   Status On-going  PT LONG TERM GOAL #6   Title Pt/wife will verbalize understanding of local Parkinson's disease related resources, including community fitness options. MODIFIED TARGET DATE 04/20/2015   Time 4   Period Weeks   Status On-going   PT LONG TERM GOAL #7   Title Pt will perform at least 8 of 10 reps of sit<>stand transfers with minimal to no UE support from 18 inch surfaces and lower, for improved efficiency and safety with transfers.   Baseline Patient is able to perform 8/10 times.  He uses minimal UE support and when fatigued begins to lean with LEs against mat creating a further posterior lean.   Time 4   Period Weeks   Status Achieved               Plan - 04/03/15 1442    Clinical Impression Statement The  patient had festinating gait today that responds to cues to stop, weight shift and return to walking.  Pt's wife reports that she has to help with HEP to have patient participate due to cognitive limitations.   PT Next Visit Plan Review HEP, pwr in modified quadriped position   Consulted and Agree with Plan of Care Patient;Family member/caregiver        Problem List Patient Active Problem List   Diagnosis Date Noted  . Parkinson's disease dementia ([place]) 09/17/2014  . BPH (benign prostatic hyperplasia) 09/17/2014  . Parkinson's disease ([place]) 09/17/2014    Dalisha Shively, PT 04/03/2015, 2:43 PM  [facility] [address] [place], [place], [address] Phone: [phone]   Fax:  [phone]  Name: Jacob Tran MRN: [id number] Date of Birth: 1933/04/26

## 2015-04-04 NOTE — Therapy (Signed)
[facility] [address] [place], [place], [address] Phone: [phone]   Fax:  [phone]  Occupational Therapy Treatment  Patient Details  Name: Jacob Tran MRN: [id number] Date of Birth: December 08, 1933 Referring Provider: Dr. Lurena Joiner Tat  Encounter Date: 04/03/2015      OT End of Session - 04/04/15 1125    Visit Number 9   Number of Visits 17   Date for OT Re-Evaluation 04/19/15   Authorization Type Medicare/BCBS, need G code   Authorization - Visit Number 9   Authorization - Number of Visits 10   OT Start Time 1152   OT Stop Time 1235   OT Time Calculation (min) 43 min      Past Medical History  Diagnosis Date  . Parkinson's disease (HCC)   . Enlarged prostate   . Hyperlipidemia   . Hypertension     Past Surgical History  Procedure Laterality Date  . Tonsillectomy      There were no vitals filed for this visit.  Visit Diagnosis:  Decreased coordination  Posture abnormality  Rigidity  Bradykinesia  Cognitive deficits      Subjective Assessment - 04/04/15 1122    Patient is accompained by: Family member   Pertinent History PD (diagnosed since 2010); Parkinson's disease dementia, hx of "back problems"   Limitations PD dementia   Patient Stated Goals improve speech, walking, and decr time for ADLs   Currently in Pain? No/denies          Treatment: donning/ doffing jacket x 2 with improved performance, Dynamic step and reach to place items in overhead cabinets to address upright posture , cueing for large amplitude movements and full finger extension with grasp/ release. Playing go-fish, with pt. drawing/ dealing cards, and  cues to speak loudly when requesting cards from other player, mod v.c overall. Therapist  encouraged pt/ wife to play at home for functional task with cognitive component. Arm bike x 5 mins for conditioning/ reciprocal movements/ v.c. To maintain 30  RPM                     OT Short Term Goals - 03/22/15 1337    OT SHORT TERM GOAL #1   Title Pt/wife will be independent with PD-specific HEP.--check STGs 03/20/15   Status Achieved   OT SHORT TERM GOAL #2   Title Pt/wife will verbalize understanding of ways to prevent future complications and appropriate community resources.   Status Achieved   OT SHORT TERM GOAL #3   Title Pt will improve coordination for ADLs as shown by improving time on 9-hole peg test by at least 4sec bilaterally.   Status Achieved  LUE 40.31, RUE 34.31           OT Long Term Goals - 02/19/15 2056    OT LONG TERM GOAL #1   Title Pt/wife will verbalize understanding of AE/strategies to incr ease with ADLs prn.--check LTGs 04/19/15   Time 8   Period Weeks   Status New   OT LONG TERM GOAL #2   Title Pt will improve functional reaching for ADLs as shown by improving score on box and blocks test by at least 5 with LUE.   Baseline 30 blocks   Period Weeks   Status New   OT LONG TERM GOAL #3   Title Pt will improve coordination for ADLs as shown by improving time on 9-hole peg test by at least 8 sec with RUE.  Baseline 50.75sec   Period Weeks   Status New   OT LONG TERM GOAL #4   Title Pt will be able to fasten/unfasten 3 buttons on table by at least 4sec.   Baseline 42.0sec   Time 8   Period Weeks   Status New   OT LONG TERM GOAL #5   Title Pt will demo decr bradykinesia as shown by demo full R elbow extension with overhead reaching at least 90% of the time.   Time 8   Period Weeks   Status New               Plan - 04/04/15 1122    Clinical Impression Statement Pt is progressing towards goals slowly yet he demonstrates improvement in functional mobility, ADLs, and posture.   Pt will benefit from skilled therapeutic intervention in order to improve on the following deficits (Retired) Decreased cognition;Decreased mobility;Decreased strength;Impaired UE functional use;Decreased  knowledge of use of DME;Decreased balance;Decreased activity tolerance;Impaired tone;Decreased coordination;Decreased range of motion   Rehab Potential Good   Clinical Impairments Affecting Rehab Potential PD dementia/cognitive deficits, decr initiation   OT Frequency 2x / week   OT Duration 8 weeks   Plan reinforce ADL strategies, G-code next visit, start checking long term goals, d/c anticipated end of next week   OT Home Exercise Plan issued PWR! supine, seated, coordination   Consulted and Agree with Plan of Care Patient        Problem List Patient Active Problem List   Diagnosis Date Noted  . Parkinson's disease dementia (HCC) 09/17/2014  . BPH (benign prostatic hyperplasia) 09/17/2014  . Parkinson's disease (HCC) 09/17/2014    Kimo Bancroft 04/04/2015, 11:26 AM Keene BreathKathryn Irbin Fines, OTR/L [phone] Phone: [phone] 11:26 AM 03/16/2017[facility] [address] [place], [place], [address] Phone: [phone]   Fax:  [phone]  Name: Jacob FuJohn Tran MRN: [id number] Date of Birth: 1933-02-22

## 2015-04-09 ENCOUNTER — Ambulatory Visit: Payer: Medicare Other | Admitting: Rehabilitative and Restorative Service Providers"

## 2015-04-09 ENCOUNTER — Ambulatory Visit: Payer: Medicare Other | Admitting: Occupational Therapy

## 2015-04-09 DIAGNOSIS — R279 Unspecified lack of coordination: Secondary | ICD-10-CM | POA: Diagnosis not present

## 2015-04-09 DIAGNOSIS — R258 Other abnormal involuntary movements: Secondary | ICD-10-CM | POA: Diagnosis not present

## 2015-04-09 DIAGNOSIS — R29898 Other symptoms and signs involving the musculoskeletal system: Secondary | ICD-10-CM

## 2015-04-09 DIAGNOSIS — R293 Abnormal posture: Secondary | ICD-10-CM

## 2015-04-09 DIAGNOSIS — R2681 Unsteadiness on feet: Secondary | ICD-10-CM

## 2015-04-09 DIAGNOSIS — R4189 Other symptoms and signs involving cognitive functions and awareness: Secondary | ICD-10-CM

## 2015-04-09 DIAGNOSIS — R269 Unspecified abnormalities of gait and mobility: Secondary | ICD-10-CM | POA: Diagnosis not present

## 2015-04-09 DIAGNOSIS — R278 Other lack of coordination: Secondary | ICD-10-CM

## 2015-04-09 NOTE — Therapy (Signed)
[facility] [address] [place], [place], [address] Phone: [phone]   Fax:  [phone]  Occupational Therapy Treatment  Patient Details  Name: Jacob Tran MRN: [id number] Date of Birth: 11-10-1933 Referring Provider: Dr. Wells Guiles Tat  Encounter Date: 04/09/2015      OT End of Session - 04/09/15 1332    Visit Number 10   Number of Visits 17   Date for OT Re-Evaluation 04/19/15   Authorization Type Medicare/BCBS, need G code   Authorization - Visit Number 10   Authorization - Number of Visits 10   OT Start Time 1320   OT Stop Time 1400   OT Time Calculation (min) 40 min      Past Medical History  Diagnosis Date  . Parkinson's disease ([place])   . Enlarged prostate   . Hyperlipidemia   . Hypertension     Past Surgical History  Procedure Laterality Date  . Tonsillectomy      There were no vitals filed for this visit.  Visit Diagnosis:  Bradykinesia  Posture abnormality  Decreased coordination  Rigidity  Cognitive deficits      Subjective Assessment - 04/09/15 1942    Subjective  "a bit" when asked if tired after PT.  Wife reports that pt is doing well with HEP at home.   Patient is accompained by: Family member   Pertinent History PD (diagnosed since 2010); Parkinson's disease dementia, hx of "back problems"   Limitations PD dementia   Patient Stated Goals improve speech, walking, and decr time for ADLs   Currently in Pain? No/denies         Treatment:   Playing go-fish, with pt. drawing/ dealing cards with cues for large amplitude movements, and cues to speak loudly when requesting cards from other player, min v.c overall. Therapist encouraged pt/ wife to play at home for functional task with cognitive component.   In sitting, functional reaching behind>overhead with each UE incorporating trunk rotation/wt. shift for posture and PWR! reach to grasp/release large pegs and place in  vertical pegboard with set up for large amplitude movements with min v.c.   Self-care:  Practicing buttoning/unbuttoning with min v.c. For PWR! Hands.  Pt demo mod difficulty and incr time overall (but may be due to fatigue today).  Began checking goals and discussing progress.                   OT Short Term Goals - 03/22/15 1337    OT SHORT TERM GOAL #1   Title Pt/wife will be independent with PD-specific HEP.--check STGs 03/20/15   Status Achieved   OT SHORT TERM GOAL #2   Title Pt/wife will verbalize understanding of ways to prevent future complications and appropriate community resources.   Status Achieved   OT SHORT TERM GOAL #3   Title Pt will improve coordination for ADLs as shown by improving time on 9-hole peg test by at least 4sec bilaterally.   Status Achieved  LUE 40.31, RUE 34.31           OT Long Term Goals - 04/09/15 1340    OT LONG TERM GOAL #1   Title Pt/wife will verbalize understanding of AE/strategies to incr ease with ADLs prn.--check LTGs 04/19/15   Time 8   Period Weeks   Status On-going   OT LONG TERM GOAL #2   Title Pt will improve functional reaching for ADLs as shown by improving score on box and blocks test by at  least 5 with LUE.   Baseline 30 blocks   Period Weeks   Status On-going  May 09, 2015 30 blocks today but limited by fatigue   OT LONG TERM GOAL #3   Title Pt will improve coordination for ADLs as shown by improving time on 9-hole peg test by at least 8 sec with RUE.   Baseline 50.75sec   Period Weeks   Status On-going  03/22/15:  34.31sec   OT LONG TERM GOAL #4   Title Pt will be able to fasten/unfasten 3 buttons on table by at least 4sec.   Baseline 42.0sec   Time 8   Period Weeks   Status On-going   OT LONG TERM GOAL #5   Title Pt will demo decr bradykinesia as shown by demo full R elbow extension with overhead reaching at least 90% of the time.   Time 8   Period Weeks   Status Achieved  05-09-2015  met                Plan - 05/09/2015 1338    Clinical Impression Statement Pt is progressing towards goals, but incr fatigue today limited ability to check LTGs (OT after PT session).   Pt would benefit from continued occupational therapy to reinforce ADL strategies/education and check/address remaining LTGs.   Pt will benefit from skilled therapeutic intervention in order to improve on the following deficits (Retired) Decreased cognition;Decreased mobility;Decreased strength;Impaired UE functional use;Decreased knowledge of use of DME;Decreased balance;Decreased activity tolerance;Impaired tone;Decreased coordination;Decreased range of motion   Rehab Potential Good   Clinical Impairments Affecting Rehab Potential PD dementia/cognitive deficits, decr initiation   OT Frequency 2x / week   OT Duration 8 weeks   Plan reinforce ADL strategies, finish checking long term goals/recheck Box and blocks test, anticipated d/c   OT Home Exercise Plan issued PWR! supine, seated, coordination   Consulted and Agree with Plan of Care Patient;Family member/caregiver   Family Member Consulted wife          G-Codes - 05/09/15 1943    Functional Assessment Tool Used 9-hole peg test:  R-34.31sec, L-40.31sec; box and blocks test:  R-38, L-30 blocks, now R shoulder flex with full elbow ext without cueing   Functional Limitation Carrying, moving and handling objects   Carrying, Moving and Handling Objects Current Status [id number]) At least 20 percent but less than 40 percent impaired, limited or restricted   Carrying, Moving and Handling Objects Goal Status (K7425) At least 20 percent but less than 40 percent impaired, limited or restricted     Occupational Therapy Progress Note  Dates of Reporting Period:02/19/15  to May 09, 2015  Objective Reports of Subjective Statement: see above  Objective Measurements: see above  Goal Update: see above  Plan: see above  Reason Skilled Services are Required: see above    Problem  List Patient Active Problem List   Diagnosis Date Noted  . Parkinson's disease dementia ([place]) 09/17/2014  . BPH (benign prostatic hyperplasia) 09/17/2014  . Parkinson's disease ([place]) 09/17/2014    [facility] May 09, 2015, 8:01 PM  [facility] [address] [place], [place], [address] Phone: [phone]   Fax:  [phone]  Name: Jock Mahon MRN: [id number] Date of Birth: 13-Jul-1933  Vianne Bulls, OTR/L [facility] [address] [place], [place]  [address] [phone] phone [phone] 05/09/2015 8:01 PM

## 2015-04-09 NOTE — Therapy (Signed)
[facility] [address] [place], [place], [address] Phone: [phone]   Fax:  [phone]  Physical Therapy Treatment  Patient Details  Name: Jacob Tran MRN: [id number] Date of Birth: 06/24/1933 Referring Provider: Alonza Bogus  Encounter Date: 04/09/2015      PT End of Session - 04/09/15 1326    Visit Number 12   Number of Visits 17   Date for PT Re-Evaluation 04/20/15   Authorization Type Medicare/BCBS-GCODE every 10th visit   PT Start Time 1235   PT Stop Time 1320   PT Time Calculation (min) 45 min   Equipment Utilized During Treatment Gait belt   Activity Tolerance Patient tolerated treatment well   Behavior During Therapy [facility] for tasks assessed/performed      Past Medical History  Diagnosis Date  . Parkinson's disease ([place])   . Enlarged prostate   . Hyperlipidemia   . Hypertension     Past Surgical History  Procedure Laterality Date  . Tonsillectomy      There were no vitals filed for this visit.  Visit Diagnosis:  Abnormality of gait  Posture abnormality  Unsteadiness  Postural instability      Subjective Assessment - 04/09/15 1239    Subjective The patient has been working on exercises, his wife reports he needs assist to initiate HEP.   Currently in Pain? No/denies           [facility] Adult PT Treatment/Exercise - 04/09/15 1329    Ambulation/Gait   Ambulation/Gait Yes   Ambulation/Gait Assistance 5: Supervision   Ambulation Distance (Feet) --  5 minutes nonstop, 3.5 minutes, 2.5 minutes   Assistive device None   Gait Pattern Step-through pattern;Decreased arm swing - right;Decreased arm swing - left;Decreased step length - right;Decreased step length - left;Lateral hip instability;Decreased trunk rotation;Lateral trunk lean to right;Trunk flexed;Poor foot clearance - left;Poor foot clearance - right  Pt holds hands clasped behind back   Ambulation Surface Level   Gait Comments  Patient had less evidence of festinating gait today, which may correlate to med doses/time of day seen.  He demonstrates continued forward flexed posture and needs cues to maintain/improve postural upright throughout session.    Neuro Re-ed    Neuro Re-ed Details  Sidestepping with cues on posture and "big" lateral steps encouraging weight shifting   Exercises   Exercises Other Exercises   Other Exercises  reverse sit up with chair propped posteriorly for cues on upright posture           PWR [facility]) - 04/09/15 1331    PWR! exercises Moves in sitting;Moves in [place];Moves in standing   PWR! Up 10   PWR! Rock 10   PWR! Twist 10   Comments modified quadriped position standing with UE weight bearing   PWR! Up 10   PWR Step 10   Comments with cues on UEs not bracing behind back and upright trunk positioning   PWR! Step 10                  PT Long Term Goals - 04/09/15 1327    PT LONG TERM GOAL #1   Title Pt will perform HEP with family's assistance for improved posture, balance, gait.  TARGET 03/21/15   Time 4   Period Weeks   Status Achieved   PT LONG TERM GOAL #2   Title Pt will improve Timed Up and Go test to less than or equal to 13.5 seconds for decreased fall risk.  Baseline Scores 12.4 seconds   Time 4   Period Weeks   Status Achieved   PT LONG TERM GOAL #3   Title Pt will improve Dynamic Gait Index to at least 15/24 for decreased fall risk.  MODIFIED TARGET DATE 04/20/2015   Baseline Improved to 12/24 to 14/24.   Time 4   Period Weeks   Status Partially Met   PT LONG TERM GOAL #4   Title Pt will tolerate at least 5 minutes of walking activities without loss of balance due to increased forward flexed posture and shuffling gait.  MODIFIED TARGET DATE 04/20/2015   Baseline Met on 04/09/2015   Time 4   Period Weeks   Status Achieved   PT LONG TERM GOAL #5   Title Pt/wife will verbalize understanidng of fall prevention within the home environment.  MODIFIED  TARGET DATE 04/20/2015   Time 4   Period Weeks   Status On-going   PT LONG TERM GOAL #6   Title Pt/wife will verbalize understanding of local Parkinson's disease related resources, including community fitness options. MODIFIED TARGET DATE 04/20/2015   Time 4   Period Weeks   Status On-going   PT LONG TERM GOAL #7   Title Pt will perform at least 8 of 10 reps of sit<>stand transfers with minimal to no UE support from 18 inch surfaces and lower, for improved efficiency and safety with transfers.   Baseline Patient is able to perform 8/10 times.  He uses minimal UE support and when fatigued begins to lean with LEs against mat creating a further posterior lean.   Time 4   Period Weeks   Status Achieved               Plan - 04/09/15 1327    Clinical Impression Statement The patient does not initiate HEP without cues from his wife.  Therefore, PT feels that current HEP should be continued post d/c with wife's input/initiation.  PT planning to d/c the patient on Friday and encouraged patient/wife to think through/discuss any questions so we could address them at his last session.   PT Next Visit Plan Check remaining LTGs, G code, d/c, provide resources on Parkinson's exercise group and support group.   Consulted and Agree with Plan of Care Patient;Family member/caregiver   Family Member Consulted wife        Problem List Patient Active Problem List   Diagnosis Date Noted  . Parkinson's disease dementia ([place]) 09/17/2014  . BPH (benign prostatic hyperplasia) 09/17/2014  . Parkinson's disease ([place]) 09/17/2014    Annalese Stiner, PT 04/09/2015, 1:35 PM  [facility] [address] [place], [place], [address] Phone: [phone]   Fax:  [phone]  Name: Jacob Tran MRN: [id number] Date of Birth: November 03, 1933

## 2015-04-12 ENCOUNTER — Ambulatory Visit: Payer: Medicare Other | Admitting: Occupational Therapy

## 2015-04-12 ENCOUNTER — Ambulatory Visit: Payer: Medicare Other | Admitting: Rehabilitative and Restorative Service Providers"

## 2015-04-12 DIAGNOSIS — R279 Unspecified lack of coordination: Secondary | ICD-10-CM

## 2015-04-12 DIAGNOSIS — R4189 Other symptoms and signs involving cognitive functions and awareness: Secondary | ICD-10-CM

## 2015-04-12 DIAGNOSIS — R258 Other abnormal involuntary movements: Secondary | ICD-10-CM | POA: Diagnosis not present

## 2015-04-12 DIAGNOSIS — R293 Abnormal posture: Secondary | ICD-10-CM

## 2015-04-12 DIAGNOSIS — R269 Unspecified abnormalities of gait and mobility: Secondary | ICD-10-CM

## 2015-04-12 DIAGNOSIS — R278 Other lack of coordination: Secondary | ICD-10-CM

## 2015-04-12 DIAGNOSIS — R29898 Other symptoms and signs involving the musculoskeletal system: Secondary | ICD-10-CM | POA: Diagnosis not present

## 2015-04-12 NOTE — Therapy (Signed)
[facility] [address] [place], [place], [address] Phone: [phone]   Fax:  [phone]  Physical Therapy Treatment  Patient Details  Name: Jacob Tran MRN: [id number] Date of Birth: 06/04/1933 Referring Provider: Tat, Rebecca  Encounter Date: 04/12/2015      PT End of Session - 04/12/15 1531    Visit Number 13   Number of Visits 17   Date for PT Re-Evaluation 04/20/15   Authorization Type Medicare/BCBS-GCODE every 10th visit   PT Start Time 1234   PT Stop Time 1320   PT Time Calculation (min) 46 min   Equipment Utilized During Treatment Gait belt   Activity Tolerance Patient tolerated treatment well   Behavior During Therapy WFL for tasks assessed/performed      Past Medical History  Diagnosis Date  . Parkinson's disease (HCC)   . Enlarged prostate   . Hyperlipidemia   . Hypertension     Past Surgical History  Procedure Laterality Date  . Tonsillectomy      There were no vitals filed for this visit.  Visit Diagnosis:  Posture abnormality  Abnormality of gait      Subjective Assessment - 04/12/15 1232    Subjective The patient is exercising with his wife.  His wife reports 4 falls in the past few nights when patient getting up.   Patient is accompained by: Family member  wife returned to PT at end of session   Patient Stated Goals Pt's goal is to help with gait-wife wants to help with shuffling gait, to stop sliding feet along.   Currently in Pain? No/denies            [facility] PT Assessment - 04/12/15 1239    Ambulation/Gait   Ambulation/Gait Yes                     [facility] Adult PT Treatment/Exercise - 04/12/15 1239    Ambulation/Gait   Ambulation/Gait Assistance 5: Supervision   Ambulation Distance (Feet) 345 Feet  x 3 reps   Assistive device None   Gait Pattern Step-through pattern;Decreased arm swing - right;Decreased arm swing - left;Decreased step length -  right;Decreased step length - left;Lateral hip instability;Decreased trunk rotation;Lateral trunk lean to right;Trunk flexed;Poor foot clearance - left;Poor foot clearance - right  Pt holds hands clasped behind back   Ambulation Surface Level   Self-Care   Self-Care Other Self-Care Comments   Other Self-Care Comments  Discussed caregiver support options with the patient's wife and provided resources.     Therapeutic Activites    Therapeutic Activities Other Therapeutic Activities   Other Therapeutic Activities --  floor<>mat x 2 reps with close supervision                PT Education - 04/12/15 1530    Education provided Yes   Education Details handout from national parkinson's foundation, handout for Adult center for enrichment, handout for power over parkinsons'   Person(s) Educated Patient;Spouse   Methods Explanation   Comprehension Verbalized understanding             PT Long Term Goals - 04/12/15 1532    PT LONG TERM GOAL #1   Title Pt will perform HEP with family's assistance for improved posture, balance, gait.  TARGET 03/21/15   Time 4   Period Weeks   Status Achieved   PT LONG TERM GOAL #2   Title Pt will improve Timed Up and Go test to less than   or equal to 13.5 seconds for decreased fall risk.   Baseline Scores 12.4 seconds   Time 4   Period Weeks   Status Achieved   PT LONG TERM GOAL #3   Title Pt will improve Dynamic Gait Index to at least 15/24 for decreased fall risk.  MODIFIED TARGET DATE 04/20/2015   Baseline Improved to 12/24 to 14/24.   Time 4   Period Weeks   Status Partially Met   PT LONG TERM GOAL #4   Title Pt will tolerate at least 5 minutes of walking activities without loss of balance due to increased forward flexed posture and shuffling gait.  MODIFIED TARGET DATE 04/20/2015   Baseline Met on 04/09/2015   Time 4   Period Weeks   Status Achieved   PT LONG TERM GOAL #5   Title Pt/wife will verbalize understanidng of fall prevention within  the home environment.  MODIFIED TARGET DATE 04/20/2015   Time 4   Period Weeks   Status Achieved   PT LONG TERM GOAL #6   Title Pt/wife will verbalize understanding of local Parkinson's disease related resources, including community fitness options. MODIFIED TARGET DATE 04/20/2015   Time 4   Period Weeks   Status Achieved   PT LONG TERM GOAL #7   Title Pt will perform at least 8 of 10 reps of sit<>stand transfers with minimal to no UE support from 18 inch surfaces and lower, for improved efficiency and safety with transfers.   Baseline Patient is able to perform 8/10 times.  He uses minimal UE support and when fatigued begins to lean with LEs against mat creating a further posterior lean.   Time 4   Period Weeks   Status Achieved               Plan - 04/12/15 1532    Clinical Impression Statement [name] arrived midway through PT session and had extensive conversation about gaining improved support in the home.  She c/o not feeling that Jacob Tran is able to relate to what she is going through and she feels isolated in his care.  PT provided resources for education and caregiver support and recommended they f/u with MD regarding sleep patterns as patient is getting up every 45-60 minutes for the first few hours of nighttime sleep and having increased nighttime falls.  Patient partially met LTGs.     PT Next Visit Plan Discharge today.   Consulted and Agree with Plan of Care Patient;Family member/caregiver   Family Member Consulted spouse          G-Codes - 04/12/15 1543    Functional Assessment Tool Used DGI=14/24   Functional Limitation Mobility: Walking and moving around   Mobility: Walking and Moving Around Goal Status (G8979) At least 20 percent but less than 40 percent impaired, limited or restricted   Mobility: Walking and Moving Around Discharge Status (G8980) At least 40 percent but less than 60 percent impaired, limited or restricted      Problem  List Patient Active Problem List   Diagnosis Date Noted  . Parkinson's disease dementia (HCC) 09/17/2014  . BPH (benign prostatic hyperplasia) 09/17/2014  . Parkinson's disease (HCC) 09/17/2014    PHYSICAL THERAPY DISCHARGE SUMMARY  Visits from Start of Care: 13  Current functional level related to goals / functional outcomes: See above   Remaining deficits: Chronic deficits in posture, high level balance with decreased carryover due to dementia.   Education / Equipment: Caregiver education, HEP, parkinson's   foundation resources, fall risk discussion.  Plan: Patient agrees to discharge.  Patient goals were partially met. Patient is being discharged due to meeting the stated rehab goals.  ?????       Thank you for the referral of this patient. Rudell Cobb, MPT  [place], PT 04/12/2015, 3:46 PM  [facility] [address] [place], [place], [address] Phone: [phone]   Fax:  [phone]  Name: Jacob Tran MRN: [id number] Date of Birth: 16-Feb-1933

## 2015-04-12 NOTE — Therapy (Signed)
[facility] [address] [place], [place], [address] Phone: [phone]   Fax:  [phone]  Occupational Therapy Treatment  Patient Details  Name: Princeton Nabor MRN: [id number] Date of Birth: 17-Jan-1934 Referring Provider: Dr. Wells Guiles Tat  Encounter Date: 04/12/2015      OT End of Session - 04/12/15 1251    Visit Number 11   Number of Visits 17   Date for OT Re-Evaluation 04/19/15   Authorization Type Medicare/BCBS, need G code   Authorization - Visit Number 11   Authorization - Number of Visits 20   OT Start Time 7544   OT Stop Time 1230   OT Time Calculation (min) 41 min   Activity Tolerance Patient tolerated treatment well   Behavior During Therapy [facility] for tasks assessed/performed      Past Medical History  Diagnosis Date  . Parkinson's disease ([place])   . Enlarged prostate   . Hyperlipidemia   . Hypertension     Past Surgical History  Procedure Laterality Date  . Tonsillectomy      There were no vitals filed for this visit.  Visit Diagnosis:  Bradykinesia  Cognitive deficits  Rigidity  Decreased coordination  Posture abnormality      Subjective Assessment - 04/12/15 1208    Patient is accompained by: Family member   Pertinent History PD (diagnosed since 2010); Parkinson's disease dementia, hx of "back problems"   Limitations PD dementia   Patient Stated Goals improve speech, walking, and decr time for ADLs   Currently in Pain? No/denies            Treatment:Pt/ wife reports pt has had multiple falls attempting to get out of be and his wife is up every hrs with him. Pt's wife reports being fatigued and that she needs assistance. Pt's wife was provided with information regarding home health aides.Therapist checked progress towards goals , pt / wife agree with d/c. See goals for progress.                    OT Short Term Goals - 03/22/15 1337    OT SHORT TERM GOAL #1    Title Pt/wife will be independent with PD-specific HEP.--check STGs 03/20/15   Status Achieved   OT SHORT TERM GOAL #2   Title Pt/wife will verbalize understanding of ways to prevent future complications and appropriate community resources.   Status Achieved   OT SHORT TERM GOAL #3   Title Pt will improve coordination for ADLs as shown by improving time on 9-hole peg test by at least 4sec bilaterally.   Status Achieved  LUE 40.31, RUE 34.31           OT Long Term Goals - 04/12/15 1217    OT LONG TERM GOAL #1   Title Pt/wife will verbalize understanding of AE/strategies to incr ease with ADLs prn.--check LTGs 04/19/15   Time 8   Period Weeks   Status Achieved   OT LONG TERM GOAL #2   Title Pt will improve functional reaching for ADLs as shown by improving score on box and blocks test by at least 5 with LUE.   Baseline 30 blocks   Period Weeks   Status Achieved  42 blocks   OT LONG TERM GOAL #3   Title Pt will improve coordination for ADLs as shown by improving time on 9-hole peg test by at least 8 sec with RUE.   Baseline 50.75sec   Period Weeks  Status Achieved  33.72 secs   OT LONG TERM GOAL #4   Title Pt will be able to fasten/unfasten 3 buttons on table by at least 4sec.   Baseline 42.0sec   Time 8   Period Weeks   Status Achieved  37.95 secs   OT LONG TERM GOAL #5   Title Pt will demo decr bradykinesia as shown by demo full R elbow extension with overhead reaching at least 90% of the time.   Time 8   Period Weeks   Status Achieved  04/09/15  met               Plan - 04/12/15 1228    Clinical Impression Statement Pt made excellent overall progress. Pt 's wife reports multiple falls at night and she is having to get up with him every 2 hrs.Therapsit provided information regarding home health aides.   Pt will benefit from skilled therapeutic intervention in order to improve on the following deficits (Retired) Decreased cognition;Decreased mobility;Decreased  strength;Impaired UE functional use;Decreased knowledge of use of DME;Decreased balance;Decreased activity tolerance;Impaired tone;Decreased coordination;Decreased range of motion   Rehab Potential Good   Clinical Impairments Affecting Rehab Potential PD dementia/cognitive deficits, decr initiation   OT Frequency 2x / week   OT Duration 8 weeks   OT Treatment/Interventions Self-care/ADL training;Therapeutic exercise;Functional Mobility Training;Patient/family education;Balance training;Splinting;Manual Therapy;Neuromuscular education;Ultrasound;Energy conservation;Therapeutic exercises;Therapeutic activities;DME and/or AE instruction;Cryotherapy;Electrical Stimulation;Fluidtherapy;Cognitive remediation/compensation;Visual/perceptual remediation/compensation;Passive range of motion;Contrast Bath;Moist Heat   Plan d/c   OT Home Exercise Plan issued PWR! supine, seated, coordination   Consulted and Agree with Plan of Care Patient;Family member/caregiver   Family Member Consulted wife     OCCUPATIONAL THERAPY DISCHARGE SUMMARY   Current functional level related to goals / functional outcomes: Pt made excellent progress and met all long term goals.   Remaining deficits: Cognitive deficits, bradykinesia, decreased balance, decreased coordination, postural abnormality   Education / Equipment: Pt/ wife were educated regarding adapted strategies for ADLs and  HEP. Pt wife verbalize understanding. Plan: Patient agrees to discharge.  Patient goals were met. Patient is being discharged due to meeting the stated rehab goals.  ?????       Problem List Patient Active Problem List   Diagnosis Date Noted  . Parkinson's disease dementia ([place]) 09/17/2014  . BPH (benign prostatic hyperplasia) 09/17/2014  . Parkinson's disease ([place]) 09/17/2014    Alim [facility]ell 04/12/2015, 12:55 PM Theone Murdoch, OTR/L [phone] Phone: [phone] 12:55 PM 03/24/2017[facility] [address] [place], [place], [address] Phone: [phone]   Fax:  [phone]  Name: Christan Defranco MRN: [id number] Date of Birth: April 15, 1933

## 2015-04-29 ENCOUNTER — Ambulatory Visit: Payer: Medicare Other | Admitting: Neurology

## 2015-04-30 ENCOUNTER — Encounter: Payer: Self-pay | Admitting: Neurology

## 2015-04-30 ENCOUNTER — Ambulatory Visit (INDEPENDENT_AMBULATORY_CARE_PROVIDER_SITE_OTHER): Payer: Medicare Other | Admitting: Neurology

## 2015-04-30 VITALS — BP 140/80 | HR 90 | Ht 70.0 in | Wt 200.0 lb

## 2015-04-30 DIAGNOSIS — G20 Parkinson's disease: Secondary | ICD-10-CM | POA: Diagnosis not present

## 2015-04-30 MED ORDER — AMBULATORY NON FORMULARY MEDICATION: 1.0000 | Freq: Every day | Status: DC
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
## 2015-05-03 ENCOUNTER — Ambulatory Visit: Payer: Medicare Other | Admitting: Neurology

## 2015-05-16 ENCOUNTER — Other Ambulatory Visit: Payer: Self-pay | Admitting: Physician Assistant

## 2015-06-25 ENCOUNTER — Other Ambulatory Visit: Payer: Self-pay | Admitting: Neurology

## 2015-06-26 NOTE — Telephone Encounter (Signed)
Carbidopa Levodopa refill requested. Per last office note- patient to remain on medication. Refill approved and sent to patient's pharmacy.   

## 2015-10-29 NOTE — Progress Notes (Signed)
Jacob FuJohn Tran was seen today in the movement disorders clinic for neurologic consultation at the request of Piedad ClimesMartin, Jacob Tran, [place].  The consultation is for the evaluation of PD.  This patient is accompanied in the office by his spouse who supplements the history.  Prior neurology records are reviewed but they only go back to 2012, which was a follow up visit.  Wife provides most of history today.  Pt was diagnosed with PD in approximately 2010.  His wife states that he was having trouble standing up and having a problem with his back; he was having injections and those were not helping and referral was made to a neurologist and he was dx with PD.  He was first tried on Mirapex, which caused hallucinations and edema.  Wife thinks that memory problems may have preceeded the diagnosis but is not sure.  Records that I have do not support that.  12/28/14 update:  The patient follows up today, accompanied by his wife who supplements the history.  The patient is on carbidopa/levodopa 25/100, 2 tablets 3 times per day.  I did ask him to hold his medication prior to his visit today but he didn't do that.  He remains on Aricept, 10 mg daily.  I referred him for physical, occupational, and speech therapy since last visit.  It appears that the rehabilitation center called him to schedule it, but the patient never returned their calls. Pt told his wife that he didn't want to go.  No falls.  No hallucinations.  No lightheadeness or near syncope.    I did have the opportunity to review records since our last visit.  The patient was first diagnosed in August, 2010 after noting left greater than right tremor and shuffling.  He was started on Mirapex 1.5 mg.  That did not seem to help and he was subsequently increased to 2.25 mg.  That caused visual hallucinations and it was discontinued.  He was 82 years old at the time.  In April, 2011 levodopa was started.  04/30/15 update:  The patient follows up today, accompanied  by his wife who supplements the history.  The patient has a history of Parkinson's disease with Parkinson's disease dementia.  He is on carbidopa/levodopa 25/100, 2 tablets 3 times per day.  I asked him to hold his medication prior to today's visit, as I have never seen him "off" medication. He states that he is "not so good."  When I asked him what wasn't so good, he states "my breathing."  He did go to physical, occupational and speech therapy since our last visit.  He did well with this.  He doesn't appear to remember this at all.  He has not had falls.  Wife states that he cannot get out of the chair without assist.   He remains on Aricept, 10 mg daily for dementia.  No hallucinations.  10/31/15 update:  Patient follows up today, accompanied by his wife who supplements the history.  I have not seen him in about 6 months.  He has a history of Parkinson's disease and associated Parkinson's disease dementia.  He is on carbidopa/levodopa 25/100, 2 tablets 3 times per day.  His memory is "horrible" per wife but he is off of aricept.  Has 24 hour/day care now as wife works.  Got a lift chair yesterday.  He has not had any hallucinations.  No lightheadedness or near syncope.  Occasionally will get up in the middle of the night to  use the restroom and will "slide" out of the bed.  Otherwise no other falls.    Neuroimaging has not previously been performed.   PREVIOUS MEDICATIONS: Mirapex (first drug that was tried per wife and it caused LE edema and hallucinations; carbidopa/levodopa (thinks that it helps tremor)  ALLERGIES:   Allergies  Allergen Reactions  . Mirapex [Pramipexole Dihydrochloride] Swelling    CURRENT MEDICATIONS:  Outpatient Encounter Prescriptions as of 10/31/2015  Medication Sig  . AMBULATORY NON FORMULARY MEDICATION 1 Device by Does not apply route daily. Lift chair  . calcium carbonate (OS-CAL) 600 MG TABS tablet Take 600 mg by mouth 1 day or 1 dose.   . carbidopa-levodopa (SINEMET  IR) 25-100 MG tablet Take 2 tablets by mouth 3  times daily  . Cholecalciferol (VITAMIN D-3 PO) Take by mouth at bedtime.  . docusate sodium (COLACE) 100 MG capsule Take 300 mg by mouth daily.  . Multiple Vitamins-Minerals (MULTIVITAMIN ADULT PO) Take by mouth daily.  . Naproxen Sodium (ALEVE PO) Take by mouth 2 (two) times daily.  Marland Kitchen RAPAFLO 4 MG CAPS capsule Take 1 capsule by mouth  daily with breakfast  . rosuvastatin (CRESTOR) 20 MG tablet Take 20 mg by mouth daily.  . [DISCONTINUED] donepezil (ARICEPT) 10 MG tablet Take 1 tablet (10 mg total) by mouth daily.   No facility-administered encounter medications on file as of 10/31/2015.     PAST MEDICAL HISTORY:   Past Medical History:  Diagnosis Date  . Enlarged prostate   . Hyperlipidemia   . Hypertension   . Parkinson's disease ([facility])     PAST SURGICAL HISTORY:   Past Surgical History:  Procedure Laterality Date  . TONSILLECTOMY      SOCIAL HISTORY:   Social History   Social History  . Marital status: Married    Spouse name: N/A  . Number of children: N/A  . Years of education: N/A   Occupational History  . Not on file.   Social History Main Topics  . Smoking status: Never Smoker  . Smokeless tobacco: Not on file  . Alcohol use 0.0 oz/week     Comment: 2 glasses of wine weekly, varies  . Drug use: No  . Sexual activity: Not on file   Other Topics Concern  . Not on file   Social History Narrative  . No narrative on file    FAMILY HISTORY:   Family Status  Relation Status  . Mother Deceased  . Father Deceased    ROS:  A complete 10 system review of systems was obtained and was unremarkable apart from what is mentioned above.  PHYSICAL EXAMINATION:    VITALS:   Vitals:   10/31/15 1345  BP: 102/70  Pulse: 81  Weight: 208 lb (94.3 kg)  Height: 5\' 11"  (1.803 m)    GEN:  The patient appears stated age and is in NAD. HEENT:  Normocephalic, atraumatic.  The mucous membranes are moist. The superficial  temporal arteries are without ropiness or tenderness. CV:  RRR Lungs:  CTAB Neck/HEME:  There are no carotid bruits bilaterally.  Neurological examination:  Orientation:  Montreal Cognitive Assessment  10/31/2015 12/28/2014  Visuospatial/ Executive (0/5) 1 5  Naming (0/3) 2 2  Attention: Read list of digits (0/2) 2 2  Attention: Read list of letters (0/1) 1 1  Attention: Serial 7 subtraction starting at 100 (0/3) 3 1  Language: Repeat phrase (0/2) 2 2  Language : Fluency (0/1) 0 1  Abstraction (0/2) 2 2  Delayed Recall (0/5) 0 0  Orientation (0/6) 0 1  Total 13 17  Adjusted Score (based on education) 13 17   Cranial nerves: There is good facial symmetry. There is significant facial hypomimia.   The visual fields are full to confrontational testing. The speech is fluent and clear.  There is very little spontaneity of speech.   Soft palate rises symmetrically and there is no tongue deviation. Hearing is intact to conversational tone. Sensation: Sensation is intact to light touch throughout. Motor: Strength is 5/5 in the bilateral upper and lower extremities.   Shoulder shrug is equal and symmetric.  There is no pronator drift.   Movement examination: Tone: There is normal tone today (and off of medication since 5 pm yesterday) Abnormal movements: none Coordination:  There is decremation with RAM's, seen with finger taps, hand opening and closing, bilaterally.  There is decreased RAM's with heel taps and toe taps bilaterally.   Gait and Station: The patient has difficulty arising out of a deep-seated chair without the use of the hands and requires assistance.  the patient's stride length is decreased.  He is not shuffling like previous.  He has a markedly stooped posture.  He has almost no arm swing.  He turns en bloc.  He has start hesitation.   ASSESSMENT/PLAN:  1.  Parkinson's disease with Parkinson's disease dementia.  -The patient will remain on carbidopa/levodopa 25/100, 2  tablets 3 times per day.  Based on visit today, I wonder if he has vascular parkinsonism, but it is rather academic now and I don't want to change medication now.  -I think he would benefit from a walker but states that therapy didn't recommend one  -The patient is off of aricept.  Talked about exelon patch (pt having some trouble swallowing) and namenda but wife wants to hold on those.  Has had a significant decrease in MoCA  -talked to him about the value of physical exercise   -glad to see he has 24 hour per day care 2.  Follow up is anticipated in the next 4-6 months, sooner should new neurologic issues arise.  Much greater than 50% of this visit was spent in counseling with the patient and the family.  Total face to face time:  25 min

## 2015-10-31 ENCOUNTER — Encounter: Payer: Self-pay | Admitting: Physician Assistant

## 2015-10-31 ENCOUNTER — Encounter: Payer: Self-pay | Admitting: Neurology

## 2015-10-31 ENCOUNTER — Ambulatory Visit (INDEPENDENT_AMBULATORY_CARE_PROVIDER_SITE_OTHER): Payer: Medicare Other | Admitting: Neurology

## 2015-10-31 VITALS — BP 102/70 | HR 81 | Ht 71.0 in | Wt 208.0 lb

## 2015-10-31 DIAGNOSIS — Z23 Encounter for immunization: Secondary | ICD-10-CM | POA: Diagnosis not present

## 2015-10-31 DIAGNOSIS — F0280 Dementia in other diseases classified elsewhere without behavioral disturbance: Secondary | ICD-10-CM | POA: Diagnosis not present

## 2015-10-31 DIAGNOSIS — G20 Parkinson's disease: Secondary | ICD-10-CM | POA: Diagnosis not present

## 2015-10-31 DIAGNOSIS — G20A1 Parkinson's disease without dyskinesia, without mention of fluctuations: Secondary | ICD-10-CM

## 2015-10-31 NOTE — Patient Instructions (Addendum)
1.  I want to see you riding your stationary bike daily  2.  email Jacob Tran about your bladder drug 3.  Let me know if you want to try the memory medications and I can call in a RX

## 2015-11-05 ENCOUNTER — Ambulatory Visit (INDEPENDENT_AMBULATORY_CARE_PROVIDER_SITE_OTHER): Payer: Medicare Other | Admitting: Physician Assistant

## 2015-11-05 ENCOUNTER — Encounter: Payer: Self-pay | Admitting: Physician Assistant

## 2015-11-05 VITALS — BP 104/74 | HR 76 | Temp 98.2°F | Resp 18 | Ht 71.0 in | Wt 208.4 lb

## 2015-11-05 DIAGNOSIS — R351 Nocturia: Secondary | ICD-10-CM

## 2015-11-05 DIAGNOSIS — N401 Enlarged prostate with lower urinary tract symptoms: Secondary | ICD-10-CM

## 2015-11-05 DIAGNOSIS — R319 Hematuria, unspecified: Secondary | ICD-10-CM | POA: Diagnosis not present

## 2015-11-05 LAB — POCT URINALYSIS DIPSTICK
Bilirubin, UA: NEGATIVE
Glucose, UA: NEGATIVE
Ketones, UA: NEGATIVE
LEUKOCYTES UA: NEGATIVE
NITRITE UA: NEGATIVE
PH UA: 5.5
PROTEIN UA: POSITIVE
Spec Grav, UA: >=1.03
Urobilinogen, UA: 0.2

## 2015-11-05 MED ORDER — TAMSULOSIN HCL 0.4 MG PO CAPS: 0.4000 mg | ORAL_CAPSULE | Freq: Every day | ORAL | 3 refills | Status: DC

## 2015-11-05 NOTE — Patient Instructions (Signed)
Please stop the Rapaflo and start the Flomax. Call me and let me know how this works.

## 2015-11-06 ENCOUNTER — Telehealth: Payer: Self-pay | Admitting: *Deleted

## 2015-11-06 DIAGNOSIS — R319 Hematuria, unspecified: Secondary | ICD-10-CM | POA: Diagnosis not present

## 2015-11-06 MED ORDER — ROSUVASTATIN CALCIUM 20 MG PO TABS: 20.0000 mg | ORAL_TABLET | Freq: Every day | ORAL | 0 refills | Status: DC

## 2015-11-06 NOTE — Telephone Encounter (Signed)
Rx request to pharmacy/[facility]  

## 2015-11-07 LAB — URINALYSIS, MICROSCOPIC ONLY
Casts: NONE SEEN [LPF]
Yeast: NONE SEEN [HPF]

## 2015-11-07 NOTE — Assessment & Plan Note (Signed)
Will stop Rapaflo and restart Flomax. Discussed importance of hydration. Reviewed potential side effects. Discussed Rapaflo may be subtherapeutic due to worsening BPH. Recommend they follow-up with Urology but they decline. Will check UA today.

## 2015-11-07 NOTE — Progress Notes (Signed)
Patient presents to clinic today with his wife for medication management. Patient with history of BPH, previously followed by Urology. Is currently on Rapaflo for symptoms. Wife endorses patient is taking as directed. Still experiencing nocturia x 3-4. Denies lightheadedness or dizziness. Denies dysuria, hematuria, urgency or frequency. Wife and patient note that Rapaflo is too expensive and they would like to restart Flomax as is cheaper and they felt it worked better.  Past Medical History:  Diagnosis Date  . Enlarged prostate   . Hyperlipidemia   . Hypertension   . Parkinson's disease [facility])     Current Outpatient Prescriptions on File Prior to Visit  Medication Sig Dispense Refill  . AMBULATORY NON FORMULARY MEDICATION 1 Device by Does not apply route daily. Lift chair 1 Device 0  . calcium carbonate (OS-CAL) 600 MG TABS tablet Take 300 mg by mouth 1 day or 1 dose.     . carbidopa-levodopa (SINEMET IR) 25-100 MG tablet Take 2 tablets by mouth 3  times daily 540 tablet 1  . Cholecalciferol (VITAMIN D-3 PO) Take by mouth at bedtime.    . docusate sodium (COLACE) 100 MG capsule Take 300 mg by mouth daily.    . Multiple Vitamins-Minerals (MULTIVITAMIN ADULT PO) Take by mouth daily.    . Naproxen Sodium (ALEVE PO) Take by mouth 2 (two) times daily.     No current facility-administered medications on file prior to visit.     Allergies  Allergen Reactions  . Mirapex [Pramipexole Dihydrochloride] Swelling    Family History  Problem Relation Age of Onset  . Lupus Daughter   . Healthy Daughter     x2    Social History   Social History  . Marital status: Married    Spouse name: N/A  . Number of children: N/A  . Years of education: N/A   Social History Main Topics  . Smoking status: Never Smoker  . Smokeless tobacco: None  . Alcohol use 0.0 oz/week     Comment: 2 glasses of wine weekly, varies  . Drug use: No  . Sexual activity: Not Asked   Other Topics Concern  .  None   Social History Narrative  . None    Review of Systems - See HPI.  All other ROS are negative.  BP 104/74 (BP Location: Right Arm, Patient Position: Sitting, Cuff Size: Large)   Pulse 76   Temp 98.2 F (36.8 C) (Oral)   Resp 18   Ht 5\' 11"  (1.803 m)   Wt 208 lb 6 oz (94.5 kg)   SpO2 93%   BMI 29.06 kg/m   Physical Exam  Constitutional: He is oriented to person, place, and time and well-developed, well-nourished, and in no distress.  HENT:  Head: Normocephalic and atraumatic.  Eyes: Conjunctivae are normal.  Cardiovascular: Normal rate, regular rhythm, normal heart sounds and intact distal pulses.   Neurological: He is alert and oriented to person, place, and time.  Skin: Skin is warm and dry.  Psychiatric: Affect normal.  Vitals reviewed.   Recent Results (from the past 2160 hour(s))  POCT urinalysis dipstick     Status: None   Collection Time: 11/05/15  5:29 PM  Result Value Ref Range   Color, UA amber    Clarity, UA dark/cloudy    Glucose, UA neg    Bilirubin, UA neg    Ketones, UA neg    Spec Grav, UA >=1.030    Blood, UA large 3+    pH,  UA 5.5    Protein, UA positive 1+    Urobilinogen, UA 0.2    Nitrite, UA neg    Leukocytes, UA Negative Negative  Urine Microscopic Only     Status: Abnormal   Collection Time: 11/06/15  4:34 PM  Result Value Ref Range   WBC, UA 0-5 <=5 WBC/HPF    Comment: The preferred specimen for urinalysis testing is the [employer] urine collection tube. Effective 10/12/2015, [facility], a [facility] [employer] [facility] company, will reject any urine cup received that is not transferred into the preferred Stockwell(R) tube. Using this device, specimen components remain stable in transit up to 72 hours at ambient temperatures. Urine may be collected in a clean, unused cup and transferred to the yellow cap transfer tube. Supply order number is: [id number]. If you have any questions, please contact your [facility] Account  Representative directly, or call our Customer Service Department at [phone].    RBC / HPF 20-40 (A) <=2 RBC/HPF   Squamous Epithelial / LPF 0-5 <=5 HPF   Bacteria, UA MANY (A) NONE SEEN HPF   Crystals See Below (A) NONE SEEN HPF    Comment: Calcium Oxalate Crystals             FEW       ABN   NONE SEEN     HPF Amorphous Sediment                   MANY      ABN   NONE SEEN     HPF    Casts NONE SEEN NONE SEEN LPF   Yeast NONE SEEN NONE SEEN HPF    Assessment/Plan: BPH (benign prostatic hyperplasia) Will stop Rapaflo and restart Flomax. Discussed importance of hydration. Reviewed potential side effects. Discussed Rapaflo may be subtherapeutic due to worsening BPH. Recommend they follow-up with Urology but they decline. Will check UA today.  Hematuria, unspecified type Noted on Urine dip. Will send for micro to further assess. - POCT urinalysis dipstick - Urine Microscopic Only    Piedad ClimesMartin, Nailyn Dearinger [place], PA-C

## 2015-11-08 ENCOUNTER — Other Ambulatory Visit: Payer: Self-pay | Admitting: *Deleted

## 2015-11-08 DIAGNOSIS — R82998 Other abnormal findings in urine: Secondary | ICD-10-CM

## 2015-11-08 DIAGNOSIS — R319 Hematuria, unspecified: Secondary | ICD-10-CM

## 2015-11-12 ENCOUNTER — Ambulatory Visit (HOSPITAL_BASED_OUTPATIENT_CLINIC_OR_DEPARTMENT_OTHER)
Admission: RE | Admit: 2015-11-12 | Discharge: 2015-11-12 | Disposition: A | Discharge status: Home / Self Care | Payer: Medicare Other | Source: Ambulatory Visit | Attending: Physician Assistant | Admitting: Physician Assistant

## 2015-11-12 DIAGNOSIS — N400 Enlarged prostate without lower urinary tract symptoms: Secondary | ICD-10-CM | POA: Diagnosis not present

## 2015-11-12 DIAGNOSIS — I700 Atherosclerosis of aorta: Secondary | ICD-10-CM | POA: Insufficient documentation

## 2015-11-12 DIAGNOSIS — I77819 Aortic ectasia, unspecified site: Secondary | ICD-10-CM | POA: Insufficient documentation

## 2015-11-12 DIAGNOSIS — N200 Calculus of kidney: Secondary | ICD-10-CM | POA: Diagnosis not present

## 2015-11-12 DIAGNOSIS — M48061 Spinal stenosis, lumbar region without neurogenic claudication: Secondary | ICD-10-CM | POA: Diagnosis not present

## 2015-11-12 DIAGNOSIS — R8299 Other abnormal findings in urine: Secondary | ICD-10-CM | POA: Insufficient documentation

## 2015-11-12 DIAGNOSIS — R319 Hematuria, unspecified: Secondary | ICD-10-CM | POA: Insufficient documentation

## 2015-11-12 DIAGNOSIS — M4186 Other forms of scoliosis, lumbar region: Secondary | ICD-10-CM | POA: Diagnosis not present

## 2015-11-12 DIAGNOSIS — R82998 Other abnormal findings in urine: Secondary | ICD-10-CM

## 2015-11-14 ENCOUNTER — Telehealth: Payer: Self-pay | Admitting: Physician Assistant

## 2015-11-14 NOTE — Telephone Encounter (Signed)
Spoke with pt's wife and she voices understanding. Pt's wife did not have any further questions.

## 2015-11-14 NOTE — Telephone Encounter (Signed)
Pt spouse Harriett Sineancy call in returning call about lab results. She says that nurse can call back at [phone]

## 2015-11-26 ENCOUNTER — Telehealth: Payer: Self-pay | Admitting: Physician Assistant

## 2015-11-26 ENCOUNTER — Encounter: Payer: Self-pay | Admitting: Physician Assistant

## 2015-11-26 DIAGNOSIS — N400 Enlarged prostate without lower urinary tract symptoms: Secondary | ICD-10-CM

## 2015-11-26 DIAGNOSIS — R319 Hematuria, unspecified: Secondary | ICD-10-CM

## 2015-11-26 NOTE — Telephone Encounter (Signed)
Referral discussed at last visit but patient and wife declined. This is why referral has not been placed and why they have not been contact.  I have placed the referral now so let's try to expedite if we can. Thank you.

## 2015-11-26 NOTE — Telephone Encounter (Signed)
Caller name: Harriett Sineancy Relationship to patient: wife Can be reached: [phone]  Reason for call: Pt wife called to f/u on Urology referral as they have not been contacted. Apologized to her that referral has not been entered but I do see it documented in notes as being recommended. Please enter and I will call to schedule pt. Please let me know if [facility] or [facility] are preferred. Pt lives in [place].

## 2015-12-25 ENCOUNTER — Other Ambulatory Visit: Payer: Self-pay | Admitting: Physician Assistant

## 2015-12-25 NOTE — Telephone Encounter (Signed)
Refill sent per [facility] refill protocol/SLS  

## 2016-01-15 DIAGNOSIS — R350 Frequency of micturition: Secondary | ICD-10-CM | POA: Diagnosis not present

## 2016-01-15 DIAGNOSIS — N401 Enlarged prostate with lower urinary tract symptoms: Secondary | ICD-10-CM | POA: Diagnosis not present

## 2016-02-02 ENCOUNTER — Other Ambulatory Visit: Payer: Self-pay | Admitting: Neurology

## 2016-02-02 ENCOUNTER — Other Ambulatory Visit: Payer: Self-pay | Admitting: Physician Assistant

## 2016-02-04 NOTE — Telephone Encounter (Signed)
Patient was sent to Urology -- [facility]. Have they seen provider yet? They should be taking over this medication.

## 2016-02-04 NOTE — Telephone Encounter (Signed)
Spoke with patient wife Harriett Sineancy. She states he did see [facility]rology [facility]. He wanted to do a INO but was unable to. She cancelled the appointment for tomorrow for follow up. He does have kidney stones but non-obstructing. She states he is going to the bathroom less on the Flomax than the Rapaflo.

## 2016-02-04 NOTE — Telephone Encounter (Signed)
Last visit 11/15/15 Referral ordered for Urology but spouse declined Please advise. Patient needs appointment

## 2016-02-10 NOTE — Telephone Encounter (Signed)
Advised patient spouse medication was sent to the pharmacy but make sure to keep a f/u appointment with Urology.

## 2016-04-28 NOTE — Progress Notes (Addendum)
Jacob Tran was seen today in the movement disorders clinic for neurologic consultation at the request of Sharlene Dory, DO.  The consultation is for the evaluation of PD.  This patient is accompanied in the office by his spouse who supplements the history.  Prior neurology records are reviewed but they only go back to 2012, which was a follow up visit.  Wife provides most of history today.  Pt was diagnosed with PD in approximately 2010.  His wife states that he was having trouble standing up and having a problem with his back; he was having injections and those were not helping and referral was made to a neurologist and he was dx with PD.  He was first tried on Mirapex, which caused hallucinations and edema.  Wife thinks that memory problems may have preceeded the diagnosis but is not sure.  Records that I have do not support that.  12/28/14 update:  The patient follows up today, accompanied by his wife who supplements the history.  The patient is on carbidopa/levodopa 25/100, 2 tablets 3 times per day.  I did ask him to hold his medication prior to his visit today but he didn't do that.  He remains on Aricept, 10 mg daily.  I referred him for physical, occupational, and speech therapy since last visit.  It appears that the rehabilitation center called him to schedule it, but the patient never returned their calls. Pt told his wife that he didn't want to go.  No falls.  No hallucinations.  No lightheadeness or near syncope.    I did have the opportunity to review records since our last visit.  The patient was first diagnosed in August, 2010 after noting left greater than right tremor and shuffling.  He was started on Mirapex 1.5 mg.  That did not seem to help and he was subsequently increased to 2.25 mg.  That caused visual hallucinations and it was discontinued.  He was 83 years old at the time.  In April, 2011 levodopa was started.  04/30/15 update:  The patient follows up today, accompanied  by his wife who supplements the history.  The patient has a history of Parkinson's disease with Parkinson's disease dementia.  He is on carbidopa/levodopa 25/100, 2 tablets 3 times per day.  I asked him to hold his medication prior to today's visit, as I have never seen him "off" medication. He states that he is "not so good."  When I asked him what wasn't so good, he states "my breathing."  He did go to physical, occupational and speech therapy since our last visit.  He did well with this.  He doesn't appear to remember this at all.  He has not had falls.  Wife states that he cannot get out of the chair without assist.   He remains on Aricept, 10 mg daily for dementia.  No hallucinations.  10/31/15 update:  Patient follows up today, accompanied by his wife who supplements the history.  I have not seen him in about 6 months.  He has a history of Parkinson's disease and associated Parkinson's disease dementia.  He is on carbidopa/levodopa 25/100, 2 tablets 3 times per day.  His memory is "horrible" per wife but he is off of aricept.  Has 24 hour/day care now as wife works.  Got a lift chair yesterday.  He has not had any hallucinations.  No lightheadedness or near syncope.  Occasionally will get up in the middle of the night to  use the restroom and will "slide" out of the bed.  Otherwise no other falls.    04/30/16 update:  Pt seen today in f/u with his wife and a caregiver who supplements the hx.  Pt on carbidopa/levodopa 25/100, 2 po tid.  Wife states "he has no memory."   Wife states patient has had no visual hallucinations but rarely he will say something about talking to his daughter who lives in [place].  However, caregiver has noted visual hallucinations.  Playing checking checkers with the air.  Has "sundowning."  Taking clothes out of drawer and throws them in the trash.  Not sleeping at night because has to go to bathroom.  Wife tried to put in cath but he took it out.  He has no falls but sometimes  slides to the floor.  He has a caregiver 5 days a week and then his wife takes care of other days.  No lightheadedness or near syncope.  He is sleeping all day and awake all night.  Cannot keep him in the bed at night.  He will pull the diaper off if they try to keep him in one.  He has trouble turning over at night.  He is in the bed about 15-16 hours per day.     Neuroimaging has not previously been performed.   PREVIOUS MEDICATIONS: Mirapex (first drug that was tried per wife and it caused LE edema and hallucinations; carbidopa/levodopa (thinks that it helps tremor)  ALLERGIES:   Allergies  Allergen Reactions  . Mirapex [Pramipexole Dihydrochloride] Swelling    CURRENT MEDICATIONS:  Outpatient Encounter Prescriptions as of 04/30/2016  Medication Sig  . carbidopa-levodopa (SINEMET IR) 25-100 MG tablet TAKE 2 TABLETS BY MOUTH 3  TIMES DAILY  . Cholecalciferol (VITAMIN D-3 PO) Take by mouth at bedtime.  . docusate sodium (COLACE) 100 MG capsule Take 100 mg by mouth daily.   . Naproxen Sodium (ALEVE PO) Take by mouth 2 (two) times daily.  . rosuvastatin (CRESTOR) 20 MG tablet TAKE 1 TABLET BY MOUTH  DAILY  . tamsulosin (FLOMAX) 0.4 MG CAPS capsule TAKE 1 CAPSULE BY MOUTH  DAILY AFTER SUPPER  . [DISCONTINUED] AMBULATORY NON FORMULARY MEDICATION 1 Device by Does not apply route daily. Lift chair  . [DISCONTINUED] calcium carbonate (OS-CAL) 600 MG TABS tablet Take 300 mg by mouth 1 day or 1 dose.   . [DISCONTINUED] Multiple Vitamins-Minerals (MULTIVITAMIN ADULT PO) Take by mouth daily.   No facility-administered encounter medications on file as of 04/30/2016.     PAST MEDICAL HISTORY:   Past Medical History:  Diagnosis Date  . Enlarged prostate   . Hyperlipidemia   . Hypertension   . Parkinson's disease (HCC)     PAST SURGICAL HISTORY:   Past Surgical History:  Procedure Laterality Date  . TONSILLECTOMY      SOCIAL HISTORY:   Social History   Social History  . Marital  status: Married    Spouse name: N/A  . Number of children: N/A  . Years of education: N/A   Occupational History  . Not on file.   Social History Main Topics  . Smoking status: Never Smoker  . Smokeless tobacco: Never Used  . Alcohol use 0.0 oz/week     Comment: 2 glasses of wine weekly, varies  . Drug use: No  . Sexual activity: Not on file   Other Topics Concern  . Not on file   Social History Narrative  . No narrative on file  FAMILY HISTORY:   Family Status  Relation Status  . Mother Deceased  . Father Deceased  . Daughter   . Daughter     ROS:  A complete 10 system review of systems was obtained and was unremarkable apart from what is mentioned above.  PHYSICAL EXAMINATION:    VITALS:   Vitals:   04/30/16 1419  BP: 110/70  Pulse: 70  SpO2: 95%  Weight: 208 lb (94.3 kg)  Height:  (1.778 m)    GEN:  The patient appears stated age and is in NAD. HEENT:  Normocephalic, atraumatic.  The mucous membranes are moist. The superficial temporal arteries are without ropiness or tenderness. CV:  RRR Lungs:  CTAB Neck/HEME:  There are no carotid bruits bilaterally.  Neurological examination:  Orientation:  Montreal Cognitive Assessment  10/31/2015 12/28/2014  Visuospatial/ Executive (0/5) 1 5  Naming (0/3) 2 2  Attention: Read list of digits (0/2) 2 2  Attention: Read list of letters (0/1) 1 1  Attention: Serial 7 subtraction starting at 100 (0/3) 3 1  Language: Repeat phrase (0/2) 2 2  Language : Fluency (0/1) 0 1  Abstraction (0/2) 2 2  Delayed Recall (0/5) 0 0  Orientation (0/6) 0 1  Total 13 17  Adjusted Score (based on education) 13 17   Cranial nerves: There is good facial symmetry. There is significant facial hypomimia.   The visual fields are full to confrontational testing. The speech is fluent and clear.  There is very little spontaneity of speech.   Soft palate rises symmetrically and there is no tongue deviation. Hearing is intact to  conversational tone. Sensation: Sensation is intact to light touch throughout. Motor: Strength is 5/5 in the bilateral upper and lower extremities.   Shoulder shrug is equal and symmetric.  There is no pronator drift.   Movement examination: Tone: There is normal tone today (and off of medication since 5 pm yesterday) Abnormal movements: mild dyskinesia in the legs Coordination:  There is decremation with RAM's, seen with finger taps, hand opening and closing, bilaterally.  There is decreased RAM's with heel taps and toe taps bilaterally.   Gait and Station: The patient has difficulty arising out of a deep-seated chair without the use of the hands and requires assistance.  the patient's stride length is decreased.  He is not shuffling like previous.  He has a markedly stooped posture.  He has almost no arm swing.  He turns en bloc.  He has start hesitation.   ASSESSMENT/PLAN:  1.  Parkinson's disease with Parkinson's disease dementia.  -The patient will remain on carbidopa/levodopa 25/100, 2 tablets 3 times per day.  Based on visit today, I wonder if he has vascular parkinsonism, but it is rather academic now and I don't want to change medication now.  -I think he would benefit from a walker but states that therapy didn't recommend one and memory may be so bad that he may not be able to use  -The patient is off of aricept.    -talked to family/caregiver about keeping stimulated during the day.  He has day/night reversal  -wife overwhelmed and talked about respite care.  Will get [facility] consult as well  -will order hospital bed  -ask PCP about potentially trying myrbetriq.  Don't know if it will help but bladder is one of wife's biggest issues.    -having hallucinations.  Talked about nuplazid but will take up to 6 weeks to work.  Talked about seroquel.  We did talk about the fact that the atypical antipsychotic medications are not indicated for dementia related psychosis and increased risk of  mortality in the elderly, usually because of infectious or  cardiac related. Understanding is expressed and they were agreeable that the benefits outweigh the risks in this case.  Will start with 25 mg q hs and call wife in few weeks.  Wife is a Engineer, civil (consulting) and expressed understanding of potential consequences.   2.  Follow up is anticipated in the next 4-6 months, sooner should new neurologic issues arise.  Much greater than 50% of this visit was spent in counseling with the patient and the family.  Total face to face time:  45 min

## 2016-04-29 ENCOUNTER — Telehealth: Payer: Self-pay | Admitting: Physician Assistant

## 2016-04-29 NOTE — Telephone Encounter (Signed)
OK w me.  

## 2016-04-29 NOTE — Telephone Encounter (Signed)
Ok with me 

## 2016-04-29 NOTE — Telephone Encounter (Signed)
Patient would like to transfer from [place] to Dr. Wendling, please advise  °

## 2016-04-29 NOTE — Telephone Encounter (Signed)
Patient scheduled for 05/04/2016 with Dr. Carmelia Roller.

## 2016-04-30 ENCOUNTER — Ambulatory Visit (INDEPENDENT_AMBULATORY_CARE_PROVIDER_SITE_OTHER): Payer: Medicare Other | Admitting: Neurology

## 2016-04-30 ENCOUNTER — Encounter: Payer: Self-pay | Admitting: Neurology

## 2016-04-30 VITALS — BP 110/70 | HR 70 | Ht 70.0 in | Wt 208.0 lb

## 2016-04-30 DIAGNOSIS — F0280 Dementia in other diseases classified elsewhere without behavioral disturbance: Secondary | ICD-10-CM

## 2016-04-30 DIAGNOSIS — G20 Parkinson's disease: Secondary | ICD-10-CM

## 2016-04-30 MED ORDER — QUETIAPINE FUMARATE 25 MG PO TABS: 25.0000 mg | ORAL_TABLET | Freq: Every day | ORAL | 2 refills | Status: DC

## 2016-04-30 NOTE — Patient Instructions (Signed)
1. Start Seroquel 25 mg - one tablet at night.  I will call in a few weeks to see how you are doing.   2. We have placed a referral to [facility].  They will call you directly to discuss services.   3. I will send an order for a hospital bed to [facility].  They will call you about getting equipment.

## 2016-04-30 NOTE — Addendum Note (Signed)
Addended bySilvio Pate on: 04/30/2016 03:02 PM   Modules accepted: Orders

## 2016-05-01 ENCOUNTER — Other Ambulatory Visit: Payer: Self-pay

## 2016-05-01 ENCOUNTER — Telehealth: Payer: Self-pay

## 2016-05-01 NOTE — Telephone Encounter (Signed)
Pre Visit call complete.  

## 2016-05-01 NOTE — Patient Outreach (Signed)
[facility]) Care Management  05/01/2016  Carrell Rahmani 26-Dec-1933 [id number]   Telephone Screen  Referral Date: 04/30/16  Referral Source: Dr. Lurena Joiner Tat Referral Reason: "Parkinson's disease"    Outreach attempt # 1. Male answered and reported that spouse was not at home at present.      Plan: RN CM will make outreach attempt to spouse within three business days.    Antionette Fairy, RN,BSN,CCM [facility] Care Management Telephonic Care Management Coordinator Direct Phone: [phone] Toll Free: [phone] Fax: [phone]

## 2016-05-04 ENCOUNTER — Encounter: Payer: Self-pay | Admitting: *Deleted

## 2016-05-04 ENCOUNTER — Encounter: Payer: Self-pay | Admitting: Family Medicine

## 2016-05-04 ENCOUNTER — Ambulatory Visit (INDEPENDENT_AMBULATORY_CARE_PROVIDER_SITE_OTHER): Payer: Medicare Other | Admitting: Family Medicine

## 2016-05-04 ENCOUNTER — Other Ambulatory Visit: Payer: Self-pay

## 2016-05-04 VITALS — BP 140/88 | HR 68 | Temp 97.9°F | Ht 70.0 in | Wt 206.2 lb

## 2016-05-04 DIAGNOSIS — N401 Enlarged prostate with lower urinary tract symptoms: Secondary | ICD-10-CM

## 2016-05-04 DIAGNOSIS — R351 Nocturia: Secondary | ICD-10-CM | POA: Diagnosis not present

## 2016-05-04 MED ORDER — TAMSULOSIN HCL 0.4 MG PO CAPS: 0.4000 mg | ORAL_CAPSULE | Freq: Every day | ORAL | 1 refills | Status: DC

## 2016-05-04 NOTE — Patient Outreach (Signed)
[facility]) Care Management  05/04/2016  Jacob Tran [birth year] [id number]   Telephone Screen  Referral Date: 04/30/16  Referral Source: Dr. Lurena Joiner Tat Referral Reason: "Parkinson's disease"   Outreach attempt #2 to patient. Spoke with spouse-ROI on file. Screening completed.  Social: Patient resides in the home with his spouse. Spouse states that she continues to work PRN as Armed forces technical officer with [employer].She has some paid caregivers that come in a few hours a day to assist and care for patient while she is away. She states that patient requires assistance with ADLs and is dependent with IADLs. She drives patient to appts. Last fall was on 04/30/16. Spouse reports that patient got up during the middle of the night and wandered into the closet and fell. Denies any injuries sustained. DME in the home include walker and rollator but patient refuses to use it but needs it per spouse. Per MD note hospital bed to be ordered. Spouse states she is still waiting to hear from DME company regarding bed. She voices that she will need in home support and caregivers to assist her with caring for patient during the night. She states she has not "slept through the night in over a year." Caregiver also interested in some possible respite resources. She voices that they relocated from [place] a little less than two years ago to be closer to her family but she has limited support to help with his care. She states that patient's pension is too high for him to qualify for Medicaid assistance.  Conditions: Patient has PMH of Parkinson's disease(2010) , HTN, HLD and enlarged prostate. Spouse reports that patient still ableto carry on meaningful conversation and communicate at times. Per MD notes patient has been having some "visual hallucinations" and "sundowning". Spouse reports that caregivers try to keep patient awake and alert as much as possible during the day but he continues to sleep  majority of the day. She states that he is up a lot during the night related to nocturia. Patient was recently started on Seroquel to help manage some of his symptoms.    Medications: Spouse reports patient is taking six meds. No issues with managing and affording meds. She gives patient his meds in yogurt to aide in swallowing safely.    Appointments: Patient has appt with PCP(Dr. Carmelia Roller at [facility]) today at 11am. He saw Dr. Loel Ro) on last week.  Advance Directives: Spouse states that patient has HCPOA and living will that was completed when they lived in [place]. She reports that she went online last night and printed off some [place] advance directives and plans to complete them while patient still mentally capable of doing so.  Consent: [facility] services reviewed and discussed. Spouse gave verbal consent for services. Spouse requests that [facility] HVs be coordinated with her work schedule.   Plan: RN CM will notify [facility] administrative assistant of case status. RN CM will send [facility] SW referral for possible community resources, in home support and respite services. RN CM will send [facility] community nurse referral for further in home eval/assessment of care needs.  Antionette Fairy, RN,BSN,CCM [facility] Care Management Telephonic Care Management Coordinator Direct Phone: [phone] Toll Free: [phone] Fax: [phone]

## 2016-05-04 NOTE — Progress Notes (Signed)
Chief Complaint  Patient presents with  . Transitions Of Care    pt's wife would like to discuss medication    Subjective: Patient is a 83 y.o. male here for discussion of medicine.He is here with his caregiver and his wife, Jacob Tran. Due to Parkinson's related dementia, he is unable to provide any history. His wife provides the majority of it.  Patient has a history of BPH on Flomax 0.4 mg daily. At his appointment with his neurologist, he was told that Flomax can contribute to confusion at night. His wife states that he has been on this for many years and thinks it is more related to his Parkinson's disease. He wakes up every 30-120 minutes at night to urinate. He was sent to urology, however the provider asks for a 72 hour urine collection, which given his mental state, was simply not feasible. The provider stated that he cannot provide any recommendations without this collection.  ROS: GU: +frequency  Family History  Problem Relation Age of Onset  . Lupus Daughter   . Healthy Daughter     x2   Past Medical History:  Diagnosis Date  . Enlarged prostate   . Hyperlipidemia   . Hypertension   . Parkinson's disease (HCC)    Allergies  Allergen Reactions  . Mirapex [Pramipexole Dihydrochloride] Swelling    Current Outpatient Prescriptions:  .  carbidopa-levodopa (SINEMET IR) 25-100 MG tablet, TAKE 2 TABLETS BY MOUTH 3  TIMES DAILY, Disp: 540 tablet, Rfl: 0 .  Cholecalciferol (VITAMIN D-3 PO), Take by mouth at bedtime., Disp: , Rfl:  .  docusate sodium (COLACE) 100 MG capsule, Take 100 mg by mouth daily. , Disp: , Rfl:  .  Naproxen Sodium (ALEVE PO), Take by mouth 2 (two) times daily., Disp: , Rfl:  .  QUEtiapine (SEROQUEL) 25 MG tablet, Take 1 tablet (25 mg total) by mouth at bedtime., Disp: 30 tablet, Rfl: 2 .  rosuvastatin (CRESTOR) 20 MG tablet, TAKE 1 TABLET BY MOUTH  DAILY, Disp: 90 tablet, Rfl: 0 .  tamsulosin (FLOMAX) 0.4 MG CAPS capsule, Take 1 capsule (0.4 mg total) by  mouth daily after supper., Disp: 180 capsule, Rfl: 1  Objective: BP 140/88 (BP Location: Left Arm, Patient Position: Sitting, Cuff Size: Normal)   Pulse 68   Temp 97.9 F (36.6 C) (Oral)   Ht  (1.778 m)   Wt 206 lb 3.2 oz (93.5 kg)   SpO2 98%   BMI 29.59 kg/m  General: Awake, appears stated age HEENT: EOMi Heart: RRR, no murmurs, no bruits Lungs: CTAB, no rales, wheezes or rhonchi. No accessory muscle use Neuro: +shuffling gait, no tremor appreciated Psych: Limited judgment and insight, minimally verbal  Assessment and Plan: Benign prostatic hyperplasia with nocturia - Plan: tamsulosin (FLOMAX) 0.4 MG CAPS capsule  Orders as above. Will increase dose of Flomax. If no improvement, will consider overactive bladder treatment. Hopefully Myrbetriq will be affordable. Follow-up as needed otherwise. [name] voiced understanding and agreement to the plan.  Jilda Roche Dumas, DO 05/04/16  4:47 PM

## 2016-05-04 NOTE — Patient Instructions (Signed)
Let us know if the increase in dosage is not helpful. We can try a different class of medicine.

## 2016-05-04 NOTE — Progress Notes (Signed)
Pre visit review using our clinic review tool, if applicable. No additional management support is needed unless otherwise documented below in the visit note. 

## 2016-05-05 ENCOUNTER — Other Ambulatory Visit: Payer: Self-pay | Admitting: *Deleted

## 2016-05-05 ENCOUNTER — Other Ambulatory Visit: Payer: Self-pay | Admitting: Neurology

## 2016-05-05 MED ORDER — QUETIAPINE FUMARATE 25 MG PO TABS: 25.0000 mg | ORAL_TABLET | Freq: Every day | ORAL | 0 refills | Status: DC

## 2016-05-05 NOTE — Patient Outreach (Signed)
[facility]) Care Management  05/05/2016  Bowdy Bair 06/07/1933 [id number]   Received a referral on 05/04/2016 from telephone nurse Antionette Fairy)  RN attempted the initial outreach and spoke with the sitter of the pt. The primary caregiver wife [name]) was not available. RN able to leave a contact name and number requested a call back from the wife due to the pt's capacity with Parkinson's/Dementia Disease. Will follow up accordingly with the call back.   Elliot Cousin, RN Care Management Coordinator [facility] Main Office [phone]

## 2016-05-05 NOTE — Patient Outreach (Signed)
[facility]) Care Management  05/05/2016  Jacob Tran 11/10/1933 [id number]   CSW made an initial attempt to try and contact patient's wife, [name] today to perform phone assessment, as well as assess and assist with social needs and services, without success.  A HIPAA complaint message was left for patient on voicemail.  CSW is currently awaiting a return call.  CSW will make a second outreach attempt in one week, if CSW does not receive a return call from Mrs. Sloniker in the meantime. Danford Bad, BSW, MSW, LCSW  Licensed Restaurant manager, fast food [facility]  Mailing [place] [address] [place], [place] [address] Physical Address-[address], [place], [place] [address] Toll Free Main # [phone] Fax # [phone] Cell # [phone]  Office # [phone] [email or web address]

## 2016-05-07 ENCOUNTER — Encounter: Payer: Self-pay | Admitting: *Deleted

## 2016-05-07 ENCOUNTER — Other Ambulatory Visit: Payer: Self-pay | Admitting: *Deleted

## 2016-05-07 NOTE — Patient Outreach (Signed)
[facility]) Care Management  05/07/2016  Jacob Tran 1933/09/23 [id number]   RN contacted pt's wife Jacob Tran) on her cell number as requested via voice message from yesterday due to pt's Dementia. Wife working today as Scientist, physiological inquired it was a good time to talk or prefer another day and/or time. Wife Jacob Tran) was able to talk today and indicated she has resolved most of the issues needed for her spouse. States she has hired help for pt throughout the day 9:30 am-5:30 pm and the night shift will start next week accordingly. States she will be using the same agency for the night shift service. Caregiver reports pt received a hospital bed on yesterday requested by his provider. Also reports recent visit to the pt's primary provider who increase the pt's Flomax to help with the night time urination. 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  RN offered further assistance with a face-to-face home visit however this offer was declined. Wife discussed she may need assistance with completing a new living will she printed from the Internet.  RN offered to send [facility] Living Will (A.D) packet and informed pt's caregiver that the social worker can assist with the process. RN verified the next schedule follow up call from the [facility] social worker Jacob Celeste S.) has noted in via EPIC. Caregiver receptive with completing the new living will. Will update social worker concerning the  A.D (pt may need assistance in completing this form) and remind social worker on the next schedule telephone call for 4/24. No other assistance needed at this time and RN left contact number for future assistance. Will close this discipline from the care team based upon the decline for case management service.  Elliot Cousin, RN Care Management Coordinator [facility] Main Office [phone]

## 2016-05-12 ENCOUNTER — Encounter: Payer: Self-pay | Admitting: *Deleted

## 2016-05-12 ENCOUNTER — Other Ambulatory Visit: Payer: Self-pay | Admitting: *Deleted

## 2016-05-12 NOTE — Patient Outreach (Addendum)
[facility]) Care Management  05/12/2016  Jacob Tran [birth year] [id number]   CSW was able to make initial contact with patient's wife, Govani Radloff today to perform phone assessment, as well as assess and assist with social work needs and services.  CSW introduced self, explained role and types of services provided through [facility] ([facility]).  CSW further explained to Mrs. Whitham that CSW works with patient's RNCM, also with [facility], Raina Mina. CSW then explained the reason for the call, indicating that Ms. Zigmund Daniel thought that patient would benefit from social work services and resources to assist with aide services in the home to provide 24 hour care and supervision to patient, currently suffering from Parkinson's Disease Dementia.  CSW obtained two HIPAA compliant identifiers from Mrs. Pawelski, which included patient's name and date of birth. Mrs. Jiles indicated that she has been able to hire aide services through a couple that she currently works with.  Mrs. Betzold went on to say that she has 24 hour care and supervision for patient from 9:30am - 5:30pm, Monday through Friday, as well as coverage from 11:00pm - 7:00am, 7 days per week.  Mrs. Vecchio admits that she is extremely pleased with the services that are being provided to patient, thus far, no longer needing assistance with obtaining private agency sitters.  Mrs. Dufford indicated that she is awaiting an Garment/textile technologist ([place] documents) to be mailed to her home, but other than that, is unable to identify any additional social work needs at present.  Mrs. Chui is aware that Ms. Zigmund Daniel has mailed the packet and that she should be receiving it in the mail any day now.  Mrs. Rastetter agreed to contact CSW directly if she needs assistance with completion of the documents. CSW will perform a case closure on patient, as all  goals of treatment have been met from social work standpoint and no additional social work needs have been identified at this time.  CSW will notify patient's RNCM with [facility], Raina Mina of CSW's plans to close patient's case.  CSW will fax an update to patient's Primary Care Physician, Dr. Riki Sheer to ensure that they are aware of CSW's involvement with patient's plan of care.  CSW will submit a case closure request to Verlon Setting, Care Management Assistant with [facility], in the form of an [employer].  CSW will ensure that Mrs. Comer is aware of Ms. Zigmund Daniel, RNCM with [facility], continued involvement with patient's care. Nat Christen, BSW, MSW, LCSW  Licensed Education officer, environmental [facility]  Mailing [place] [address], [place], [place] [address] Physical Address-[address], [place], [place] [address] Toll Free Main # [phone] Fax # [phone] Cell # [phone]  Office # [phone] [email or web address]

## 2016-05-20 ENCOUNTER — Telehealth: Payer: Self-pay | Admitting: Neurology

## 2016-05-20 NOTE — Telephone Encounter (Signed)
Called patient's wife to see how he was doing on Seroquel 25 mg - 1 tablet at night.  She states she does not see a difference.  I advised her to add a tablet in the morning so he is taking Seroquel 25 mg 1 BID. She states that the main problem is night time and not during the day. Should he take two tablets in the evening instead?  Please advise.

## 2016-05-21 NOTE — Telephone Encounter (Signed)
That sounds good

## 2016-05-21 NOTE — Telephone Encounter (Signed)
Patient's wife aware will increase to two tablets at bedtime.

## 2016-05-25 ENCOUNTER — Encounter: Payer: Self-pay | Admitting: *Deleted

## 2016-06-01 ENCOUNTER — Telehealth: Payer: Self-pay | Admitting: Family Medicine

## 2016-06-01 DIAGNOSIS — R351 Nocturia: Principal | ICD-10-CM

## 2016-06-01 DIAGNOSIS — N401 Enlarged prostate with lower urinary tract symptoms: Secondary | ICD-10-CM

## 2016-06-01 NOTE — Telephone Encounter (Signed)
Caller name: Helene Kelpancy Willette Relationship to patient: wife Can be reached: [phone] Pharmacy: [facility]  Reason for call: Pt wife called stating at OV 05/04/16 pt was advised to take Flomax 2/day. He has been but the RX sent in was for 1/day. Please update RX and send in to [facility] in 90 day supply for 2/day. Pt has 1 week on hand.

## 2016-06-01 NOTE — Telephone Encounter (Signed)
Please advise.//AB/CMA 

## 2016-06-02 MED ORDER — TAMSULOSIN HCL 0.4 MG PO CAPS: 0.8000 mg | ORAL_CAPSULE | Freq: Every day | ORAL | 3 refills | Status: DC

## 2016-06-02 NOTE — Telephone Encounter (Signed)
Done

## 2016-06-03 ENCOUNTER — Telehealth: Payer: Self-pay | Admitting: Neurology

## 2016-06-03 NOTE — Telephone Encounter (Signed)
Caller: Harriett SineNancy  Urgent?   Reason for the call: VM-Left a message asking for a call back from Dr Don Perkingat's nurse and did not say why

## 2016-06-03 NOTE — Telephone Encounter (Signed)
Spoke with patient's wife.  She states she is having a lot of issues with patient having nocturnal urinary incontinence. He is up every 5-10 minutes to use the bathroom. They now have night time nurses. She believes that if he had a catheter at night that it would not get pulled out now that she has someone watching him. She asked her PCP about this and was told to run it by Dr. Arbutus Leasat.  She was not happy with the care they received by the urologist and don't want to go back. I encouraged her to call and ask for a different urologist, because this is something that they would need to discuss with a urologist - it would be out of Dr. Don Perkingat's scope of practice.  She will do this.  She does not know if Seroquel is helping (25 mg - 2 tablets at night) because the patient is not sleeping because of the incontinence issue and is roaming the house. She did ask for a 50 mg tablet to be sent to [facility].   Please advise.

## 2016-06-03 NOTE — Telephone Encounter (Signed)
Okay to send that RX and will evaluate more once the bladder is better taken care of.

## 2016-06-04 MED ORDER — QUETIAPINE FUMARATE 50 MG PO TABS: 50.0000 mg | ORAL_TABLET | Freq: Every day | ORAL | 1 refills | Status: DC

## 2016-06-04 NOTE — Telephone Encounter (Signed)
I had let patient's wife know I would call if she had any suggestions other than to see urology.   RX sent to pharmacy.

## 2016-06-22 ENCOUNTER — Other Ambulatory Visit: Payer: Self-pay | Admitting: Neurology

## 2016-06-22 ENCOUNTER — Other Ambulatory Visit: Payer: Self-pay | Admitting: Physician Assistant

## 2016-06-22 ENCOUNTER — Ambulatory Visit: Payer: Medicare Other | Admitting: *Deleted

## 2016-06-22 NOTE — Telephone Encounter (Signed)
I am happy to refill this, but I am not convinced he will receive the full benefits of being on Crestor. Have his wife Harriett Sineancy let us know if she would like him to continue taking this. TY.

## 2016-06-24 ENCOUNTER — Other Ambulatory Visit: Payer: Self-pay | Admitting: *Deleted

## 2016-06-24 NOTE — Telephone Encounter (Signed)
See refill request on (06/22/16).//AB/CMA

## 2016-07-02 DIAGNOSIS — R339 Retention of urine, unspecified: Secondary | ICD-10-CM | POA: Diagnosis not present

## 2016-07-02 DIAGNOSIS — N401 Enlarged prostate with lower urinary tract symptoms: Secondary | ICD-10-CM | POA: Diagnosis not present

## 2016-07-02 DIAGNOSIS — N138 Other obstructive and reflux uropathy: Secondary | ICD-10-CM | POA: Diagnosis not present

## 2016-07-02 DIAGNOSIS — R300 Dysuria: Secondary | ICD-10-CM | POA: Diagnosis not present

## 2016-07-02 DIAGNOSIS — R358 Other polyuria: Secondary | ICD-10-CM | POA: Diagnosis not present

## 2016-07-09 DIAGNOSIS — N401 Enlarged prostate with lower urinary tract symptoms: Secondary | ICD-10-CM | POA: Diagnosis not present

## 2016-07-09 DIAGNOSIS — N138 Other obstructive and reflux uropathy: Secondary | ICD-10-CM | POA: Diagnosis not present

## 2016-07-21 NOTE — Telephone Encounter (Signed)
Spoke to his wife and he does not need it now and has plenty. Will have PCP check cholesterol when in the office

## 2016-07-27 DIAGNOSIS — R339 Retention of urine, unspecified: Secondary | ICD-10-CM | POA: Diagnosis not present

## 2016-07-27 DIAGNOSIS — N401 Enlarged prostate with lower urinary tract symptoms: Secondary | ICD-10-CM | POA: Diagnosis not present

## 2016-07-27 DIAGNOSIS — N138 Other obstructive and reflux uropathy: Secondary | ICD-10-CM | POA: Diagnosis not present

## 2016-07-27 DIAGNOSIS — R300 Dysuria: Secondary | ICD-10-CM | POA: Diagnosis not present

## 2016-07-30 DIAGNOSIS — N138 Other obstructive and reflux uropathy: Secondary | ICD-10-CM | POA: Diagnosis not present

## 2016-07-30 DIAGNOSIS — N401 Enlarged prostate with lower urinary tract symptoms: Secondary | ICD-10-CM | POA: Diagnosis not present

## 2016-08-13 ENCOUNTER — Ambulatory Visit (INDEPENDENT_AMBULATORY_CARE_PROVIDER_SITE_OTHER): Payer: Medicare Other | Admitting: Family Medicine

## 2016-08-13 ENCOUNTER — Encounter: Payer: Self-pay | Admitting: Family Medicine

## 2016-08-13 VITALS — BP 155/90 | HR 82 | Temp 98.2°F | Ht 70.0 in | Wt 221.0 lb

## 2016-08-13 DIAGNOSIS — E785 Hyperlipidemia, unspecified: Secondary | ICD-10-CM | POA: Diagnosis not present

## 2016-08-13 DIAGNOSIS — R339 Retention of urine, unspecified: Secondary | ICD-10-CM

## 2016-08-13 DIAGNOSIS — R609 Edema, unspecified: Secondary | ICD-10-CM | POA: Diagnosis not present

## 2016-08-13 DIAGNOSIS — R635 Abnormal weight gain: Secondary | ICD-10-CM

## 2016-08-13 LAB — LIPID PANEL
CHOLESTEROL: 120 mg/dL (ref 0–200)
HDL: 54.9 mg/dL (ref >39.00)
LDL Cholesterol: 47 mg/dL (ref 0–99)
NonHDL: 64.73
Total CHOL/HDL Ratio: 2
Triglycerides: 89 mg/dL (ref 0.0–149.0)
VLDL: 17.8 mg/dL (ref 0.0–40.0)

## 2016-08-13 LAB — COMPREHENSIVE METABOLIC PANEL WITH GFR
ALT: 5 U/L (ref 0–53)
AST: 13 U/L (ref 0–37)
Albumin: 3.7 g/dL (ref 3.5–5.2)
Alkaline Phosphatase: 88 U/L (ref 39–117)
BUN: 27 mg/dL — ABNORMAL HIGH (ref 6–23)
CO2: 27 meq/L (ref 19–32)
Calcium: 9.4 mg/dL (ref 8.4–10.5)
Chloride: 106 meq/L (ref 96–112)
Creatinine, Ser: 1.11 mg/dL (ref 0.40–1.50)
GFR: 67.59 mL/min (ref >60.00)
Glucose, Bld: 105 mg/dL — ABNORMAL HIGH (ref 70–99)
Potassium: 4.2 meq/L (ref 3.5–5.1)
Sodium: 140 meq/L (ref 135–145)
Total Bilirubin: 0.5 mg/dL (ref 0.2–1.2)
Total Protein: 6.9 g/dL (ref 6.0–8.3)

## 2016-08-13 NOTE — Progress Notes (Signed)
Chief Complaint  Patient presents with  . Urinary Retention    x  2 mos    Subjective: Patient is a 83 y.o. male here for urinary retention.The patient has Parkinson related dementia.  He is here with his wife, Jacob Tran and caretaker.  The patient has had a several month history of urinary retention that is worsening. He has been to 2 different urologists. His most recent urologist performed 2 cystoscopies in stating that there is no objective data of obstruction from the prostate. The most recent plan was to do a transurethral resection of the prostate to trim the edges to perhaps help with symptoms. [name] was not in agreement with this plan because of the assessment that the prostate was not causing the obstruction. The initial urologist he saw prior to this wanted him to keep a voiding log and 24-hour urine specimen, which was impossible because he is incontinent and demented. She has been using Foley catheters overnight and has been getting out 2 L. She is a former Engineer, civil (consulting)nurse and has been using sterile technique. She's been told not to do this by his urologist due to risk of infection. She states that at this point, he is able to sleep throughout the night and is not wetting the bed. He will whisper, "thank you." every time she does it also.   His wife is concerned his urinary issue may be related to swelling. He has gained around 10-15 pounds since he was last here. He will have swelling in his lower extremities as well.  ROS: Lungs: Denies SOB, cough  GU: As noted in HPI  Family History  Problem Relation Age of Onset  . Lupus Daughter   . Healthy Daughter        x2   Past Medical History:  Diagnosis Date  . 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 Enlarged prostate   . Hyperlipidemia   . Hypertension   . Parkinson's disease (HCC)    Allergies  Allergen Reactions  . Mirapex [Pramipexole Dihydrochloride] Swelling    Current Outpatient  Prescriptions:  .  carbidopa-levodopa (SINEMET IR) 25-100 MG tablet, TAKE 2 TABLETS BY MOUTH 3  TIMES DAILY, Disp: 540 tablet, Rfl: 3 .  Cholecalciferol (VITAMIN D-3 PO), Take by mouth at bedtime., Disp: , Rfl:  .  docusate sodium (COLACE) 100 MG capsule, Take 100 mg by mouth daily. , Disp: , Rfl:  .  Naproxen Sodium (ALEVE PO), Take by mouth 2 (two) times daily., Disp: , Rfl:  .  QUEtiapine (SEROQUEL) 50 MG tablet, Take 1 tablet (50 mg total) by mouth at bedtime., Disp: 90 tablet, Rfl: 1 .  rosuvastatin (CRESTOR) 20 MG tablet, TAKE 1 TABLET BY MOUTH  DAILY, Disp: 90 tablet, Rfl: 0   Objective: BP (!) 155/90 (BP Location: Left Arm, Patient Position: Sitting, Cuff Size: Normal)   Pulse 82   Temp 98.2 F (36.8 C) (Oral)   Ht 5\' 10"  (1.778 m)   Wt 221 lb (100.2 kg)   SpO2 100%   BMI 31.71 kg/m  General: Awake, appears stated age HEENT: MMM, EOMi Heart: RRR, 1+ pitting edema b/l Lungs: CTAB, no rales, wheezes or rhonchi. No accessory muscle use Abd: BS+, soft, NT, ND, no masses or organomegaly Psych: Nonverbal, cooperative, limited judgment and insight  Assessment and Plan: Urinary retention  Weight gain  Edema, unspecified type - Plan: Comprehensive metabolic panel  Hyperlipidemia, unspecified hyperlipidemia type - Plan: Lipid panel  Orders as  above. I wrote a prescription for a Foley catheter, [address] [place], and associated supplies. His wife voiced understanding of risks associated with this including, but not limited to, infection and possibly even causing death (indirectly). She states that based off of the relief that he gets, she is more than willing to accept this risk. I will obtain some labs based off of his edema. Follow-up as needed. [name] voiced understanding and agreement to the plan.  Jilda Rocheicholas Paul [place] CityW[place], DO 08/13/16  12:11 PM

## 2016-09-01 NOTE — Progress Notes (Signed)
Anne Fu was seen today in the movement disorders clinic for neurologic consultation at the request of Sharlene Dory, DO.  The consultation is for the evaluation of PD.  This patient is accompanied in the office by his spouse who supplements the history.  Prior neurology records are reviewed but they only go back to 2012, which was a follow up visit.  Wife provides most of history today.  Pt was diagnosed with PD in approximately 2010.  His wife states that he was having trouble standing up and having a problem with his back; he was having injections and those were not helping and referral was made to a neurologist and he was dx with PD.  He was first tried on Mirapex, which caused hallucinations and edema.  Wife thinks that memory problems may have preceeded the diagnosis but is not sure.  Records that I have do not support that.  12/28/14 update:  The patient follows up today, accompanied by his wife who supplements the history.  The patient is on carbidopa/levodopa 25/100, 2 tablets 3 times per day.  I did ask him to hold his medication prior to his visit today but he didn't do that.  He remains on Aricept, 10 mg daily.  I referred him for physical, occupational, and speech therapy since last visit.  It appears that the rehabilitation center called him to schedule it, but the patient never returned their calls. Pt told his wife that he didn't want to go.  No falls.  No hallucinations.  No lightheadeness or near syncope.    I did have the opportunity to review records since our last visit.  The patient was first diagnosed in August, 2010 after noting left greater than right tremor and shuffling.  He was started on Mirapex 1.5 mg.  That did not seem to help and he was subsequently increased to 2.25 mg.  That caused visual hallucinations and it was discontinued.  He was 83 years old at the time.  In April, 2011 levodopa was started.  04/30/15 update:  The patient follows up today, accompanied  by his wife who supplements the history.  The patient has a history of Parkinson's disease with Parkinson's disease dementia.  He is on carbidopa/levodopa 25/100, 2 tablets 3 times per day.  I asked him to hold his medication prior to today's visit, as I have never seen him "off" medication. He states that he is "not so good."  When I asked him what wasn't so good, he states "my breathing."  He did go to physical, occupational and speech therapy since our last visit.  He did well with this.  He doesn't appear to remember this at all.  He has not had falls.  Wife states that he cannot get out of the chair without assist.   He remains on Aricept, 10 mg daily for dementia.  No hallucinations.  10/31/15 update:  Patient follows up today, accompanied by his wife who supplements the history.  I have not seen him in about 6 months.  He has a history of Parkinson's disease and associated Parkinson's disease dementia.  He is on carbidopa/levodopa 25/100, 2 tablets 3 times per day.  His memory is "horrible" per wife but he is off of aricept.  Has 24 hour/day care now as wife works.  Got a lift chair yesterday.  He has not had any hallucinations.  No lightheadedness or near syncope.  Occasionally will get up in the middle of the night to  use the restroom and will "slide" out of the bed.  Otherwise no other falls.    04/30/16 update:  Pt seen today in f/u with his wife and a caregiver who supplements the hx.  Pt on carbidopa/levodopa 25/100, 2 po tid.  Wife states "he has no memory."   Wife states patient has had no visual hallucinations but rarely he will say something about talking to his daughter who lives in [place].  However, caregiver has noted visual hallucinations.  Playing checking checkers with the air.  Has "sundowning."  Taking clothes out of drawer and throws them in the trash.  Not sleeping at night because has to go to bathroom.  Wife tried to put in cath but he took it out.  He has no falls but sometimes  slides to the floor.  He has a caregiver 5 days a week and then his wife takes care of other days.  No lightheadedness or near syncope.  He is sleeping all day and awake all night.  Cannot keep him in the bed at night.  He will pull the diaper off if they try to keep him in one.  He has trouble turning over at night.  He is in the bed about 15-16 hours per day.    09/03/16 update:  Patient seen today in follow-up for his parkinsonism, accompanied by his 2 caregivers who supplement the history.  Patient is on carbidopa/levodopa 25/100, 2 tablets 3 times per day.  We started quetiapine last visit and worked up to 50 mg at night.  We ended up stopping the titration, primarily because he was having some bladder issues that he was up all night.  He did not care for the urologist that he was seeing.  He ended up following up with his primary care physician for his urology issues.  I have reviewed those notes.  Ultimately, they decided to use nighttime Foley catheter.  That has been helpful and he is sleeping at night with that.  Hallucinations are better and the caregivers think that is because of better sleep.  Wife thought that his seroquel was for bladder and didn't want to continue but when found out for hallucinations they realized it could be helping   Neuroimaging has not previously been performed.   PREVIOUS MEDICATIONS: Mirapex (first drug that was tried per wife and it caused LE edema and hallucinations; carbidopa/levodopa (thinks that it helps tremor)  ALLERGIES:   Allergies  Allergen Reactions  . Mirapex [Pramipexole Dihydrochloride] Swelling    CURRENT MEDICATIONS:  Outpatient Encounter Prescriptions as of 09/03/2016  Medication Sig  . carbidopa-levodopa (SINEMET IR) 25-100 MG tablet TAKE 2 TABLETS BY MOUTH 3  TIMES DAILY  . Cholecalciferol (VITAMIN D-3 PO) Take by mouth at bedtime.  . docusate sodium (COLACE) 100 MG capsule Take 100 mg by mouth daily.   . Naproxen Sodium (ALEVE PO) Take by  mouth 2 (two) times daily.  . QUEtiapine (SEROQUEL) 50 MG tablet Take 1 tablet (50 mg total) by mouth at bedtime.  . rosuvastatin (CRESTOR) 20 MG tablet TAKE 1 TABLET BY MOUTH  DAILY  . tamsulosin (FLOMAX) 0.4 MG CAPS capsule    No facility-administered encounter medications on file as of 09/03/2016.     PAST MEDICAL HISTORY:   Past Medical History:  Diagnosis Date  . 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 Enlarged prostate   . Hyperlipidemia   . Hypertension   . Parkinson's disease (HCC)  PAST SURGICAL HISTORY:   Past Surgical History:  Procedure Laterality Date  . TONSILLECTOMY      SOCIAL HISTORY:   Social History   Social History  . Marital status: Married    Spouse name: N/A  . Number of children: N/A  . Years of education: N/A   Occupational History  . Not on file.   Social History Main Topics  . Smoking status: Never Smoker  . Smokeless tobacco: Never Used  . Alcohol use 0.0 oz/week     Comment: 2 glasses of wine weekly, varies  . Drug use: No  . Sexual activity: Not on file   Other Topics Concern  . Not on file   Social History Narrative  . No narrative on file    FAMILY HISTORY:   Family Status  Relation Status  . Mother Deceased  . Father Deceased  . Daughter (Not Specified)  . Daughter (Not Specified)    ROS:  A complete 10 system review of systems was obtained and was unremarkable apart from what is mentioned above.  PHYSICAL EXAMINATION:    VITALS:   Vitals:   09/03/16 1447  BP: 120/84  Pulse: 85  SpO2: 93%  Weight: 218 lb (98.9 kg)   Wt Readings from Last 3 Encounters:  09/03/16 218 lb (98.9 kg)  08/13/16 221 lb (100.2 kg)  05/04/16 206 lb 3.2 oz (93.5 kg)     GEN:  The patient appears stated age and is in NAD. HEENT:  Normocephalic, atraumatic.  The mucous membranes are moist. The superficial temporal arteries are without ropiness or tenderness. CV:  RRR Lungs:  CTAB Neck/HEME:   There are no carotid bruits bilaterally.  Neurological examination:  Orientation:  Montreal Cognitive Assessment  10/31/2015 12/28/2014  Visuospatial/ Executive (0/5) 1 5  Naming (0/3) 2 2  Attention: Read list of digits (0/2) 2 2  Attention: Read list of letters (0/1) 1 1  Attention: Serial 7 subtraction starting at 100 (0/3) 3 1  Language: Repeat phrase (0/2) 2 2  Language : Fluency (0/1) 0 1  Abstraction (0/2) 2 2  Delayed Recall (0/5) 0 0  Orientation (0/6) 0 1  Total 13 17  Adjusted Score (based on education) 13 17   Cranial nerves: There is good facial symmetry. There is significant facial hypomimia.   The visual fields are full to confrontational testing. The speech is fluent and clear.  There is very little spontaneity of speech.   Soft palate rises symmetrically and there is no tongue deviation. Hearing is intact to conversational tone. Sensation: Sensation is intact to light touch throughout. Motor: Strength is 5/5 in the bilateral upper and lower extremities.   Shoulder shrug is equal and symmetric.  There is no pronator drift.   Movement examination: Tone: There is gegenhaltedn in the bilateral UE Abnormal movements: none Coordination:  There is decremation with RAM's, seen with finger taps, hand opening and closing, bilaterally.  There is decreased RAM's with heel taps and toe taps bilaterally.   Some of this is related to apraxia Gait and Station: The patient has difficulty arising out of a deep-seated chair without the use of the hands and requires assistance. Gait belt is used.   the patient's stride length is decreased.  He is not shuffling like previous.  He has a markedly stooped posture.  He has almost no arm swing.  He turns en bloc.  He has start hesitation.   ASSESSMENT/PLAN:  1.  Parkinson's disease with Parkinson's disease  dementia.  -The patient will remain on carbidopa/levodopa 25/100, 2 tablets 3 times per day.  Based on visit today, I wonder if he has  vascular parkinsonism, but it is rather academic now and I don't want to change medication now.  -I think he would benefit from a walker but states that therapy didn't recommend one and memory may be so bad that he may not be able to use.  Has caregivers for assist  -The patient is off of aricept.    -RX transport chair at request  -on seroquel, 50 mg q hs.  Hallucinations improved.  We did talk about the fact that the atypical antipsychotic medications are not indicated for dementia related psychosis and increased risk of mortality in the elderly, usually because of infectious or  cardiac related. Understanding is expressed and they were agreeable that the benefits outweigh the risks in this case.  2.  Urinary incontinence  -Is using Foley catheter at night.  3.  Follow up is anticipated in the next 6 months, sooner should new neurologic issues arise.  Much greater than 50% of this visit was spent in counseling with the patient and the family.  Total face to face time:  25 min

## 2016-09-03 ENCOUNTER — Ambulatory Visit (INDEPENDENT_AMBULATORY_CARE_PROVIDER_SITE_OTHER): Payer: Medicare Other | Admitting: Neurology

## 2016-09-03 ENCOUNTER — Encounter: Payer: Self-pay | Admitting: Neurology

## 2016-09-03 VITALS — BP 120/84 | HR 85 | Wt 218.0 lb

## 2016-09-03 DIAGNOSIS — G20 Parkinson's disease: Secondary | ICD-10-CM

## 2016-09-03 DIAGNOSIS — F0280 Dementia in other diseases classified elsewhere without behavioral disturbance: Secondary | ICD-10-CM

## 2016-09-03 DIAGNOSIS — G20A1 Parkinson's disease without dyskinesia, without mention of fluctuations: Secondary | ICD-10-CM

## 2016-09-03 MED ORDER — AMBULATORY NON FORMULARY MEDICATION: 1.0000 | Freq: Every day | 0 refills | Status: DC

## 2016-09-03 NOTE — Patient Instructions (Signed)
Continue Seroquel 50 mg at bedtime

## 2016-09-04 ENCOUNTER — Telehealth: Payer: Self-pay | Admitting: Neurology

## 2016-09-04 NOTE — Telephone Encounter (Signed)
Letter written and faxed.

## 2016-09-04 NOTE — Telephone Encounter (Signed)
Patient's wife called regarding needing a letter from Dr. Arbutus Leas telling why he needs the Transport Chair sent to [facility]. The Fax # [phone]. Thank you

## 2016-10-01 ENCOUNTER — Other Ambulatory Visit: Payer: Self-pay | Admitting: Neurology

## 2016-10-25 DIAGNOSIS — Z23 Encounter for immunization: Secondary | ICD-10-CM | POA: Diagnosis not present

## 2016-11-02 ENCOUNTER — Telehealth: Payer: Self-pay | Admitting: Family Medicine

## 2016-11-02 NOTE — Telephone Encounter (Signed)
Called number listed left a detailed message of verbal ok for catheter supplies.

## 2016-11-02 NOTE — Telephone Encounter (Signed)
Caller name: Colin Mulders  Relation to pt: from [employer] back number: [phone] direct [phone]     Reason for call:  Seeking verbal orders for patient catheter supplies

## 2016-11-03 ENCOUNTER — Telehealth: Payer: Self-pay | Admitting: *Deleted

## 2016-11-03 NOTE — Telephone Encounter (Signed)
Medical supply called to verify all information and supporting diagnosis to cover these supplies. I was ableto give them the information and diagnosis needed to support this need. They will be faxing over something today/PCP will sign when in the office tomorrow and we will fax back to  Them.

## 2016-11-03 NOTE — Telephone Encounter (Signed)
Received Physician Orders from [facility]; forwarded to provider/SLS 10/16

## 2016-11-04 NOTE — Telephone Encounter (Signed)
Caller name: Colin MuldersBrianna  Relation to pt: [employer] Call back number: [phone] ext [phone]    Reason for call:  patient requesting specific catheter therefore requesting new verbal orders, please advise

## 2016-11-04 NOTE — Telephone Encounter (Signed)
Copy & Pasted: Telephone   11/02/2016 [facility]  MoranWendling, Jilda RocheNicholas Paul, [place]  Family Medicine   catheter supplies   Reason for call   Conversation: catheter supplies  (Newest Message First)  November 03, 2016  Ewing, Vladimir CroftsRobin B, CMA   8:42 AM  Note    Medical supply called to verify all information and supporting diagnosis to cover these supplies. I was ableto give them the information and diagnosis needed to support this need. They will be faxing over something today/PCP will sign when in the office tomorrow and we will fax back to  Them.    November 02, 2016  Ewing, Vladimir CroftsRobin B, CMA    6:03 PM  Note    Called number listed left a detailed message of verbal ok for catheter supplies.

## 2016-11-05 NOTE — Telephone Encounter (Signed)
[employer] and all is done.

## 2016-11-17 ENCOUNTER — Telehealth: Payer: Self-pay | Admitting: *Deleted

## 2016-11-17 NOTE — Telephone Encounter (Signed)
[facility]; forwarded to provider/SLS 10/30

## 2016-12-24 NOTE — Progress Notes (Signed)
Jacob Tran was seen today in the movement disorders clinic for neurologic consultation at the request of Sharlene Dory, DO.  The consultation is for the evaluation of PD.  This patient is accompanied in the office by his spouse who supplements the history.  Prior neurology records are reviewed but they only go back to 2012, which was a follow up visit.  Wife provides most of history today.  Pt was diagnosed with PD in approximately 2010.  His wife states that he was having trouble standing up and having a problem with his back; he was having injections and those were not helping and referral was made to a neurologist and he was dx with PD.  He was first tried on Mirapex, which caused hallucinations and edema.  Wife thinks that memory problems may have preceeded the diagnosis but is not sure.  Records that I have do not support that.  12/28/14 update:  The patient follows up today, accompanied by his wife who supplements the history.  The patient is on carbidopa/levodopa 25/100, 2 tablets 3 times per day.  I did ask him to hold his medication prior to his visit today but he didn't do that.  He remains on Aricept, 10 mg daily.  I referred him for physical, occupational, and speech therapy since last visit.  It appears that the rehabilitation center called him to schedule it, but the patient never returned their calls. Pt told his wife that he didn't want to go.  No falls.  No hallucinations.  No lightheadeness or near syncope.    I did have the opportunity to review records since our last visit.  The patient was first diagnosed in August, 2010 after noting left greater than right tremor and shuffling.  He was started on Mirapex 1.5 mg.  That did not seem to help and he was subsequently increased to 2.25 mg.  That caused visual hallucinations and it was discontinued.  He was 83 years old at the time.  In April, 2011 levodopa was started.  04/30/15 update:  The patient follows up today, accompanied  by his wife who supplements the history.  The patient has a history of Parkinson's disease with Parkinson's disease dementia.  He is on carbidopa/levodopa 25/100, 2 tablets 3 times per day.  I asked him to hold his medication prior to today's visit, as I have never seen him "off" medication. He states that he is "not so good."  When I asked him what wasn't so good, he states "my breathing."  He did go to physical, occupational and speech therapy since our last visit.  He did well with this.  He doesn't appear to remember this at all.  He has not had falls.  Wife states that he cannot get out of the chair without assist.   He remains on Aricept, 10 mg daily for dementia.  No hallucinations.  10/31/15 update:  Patient follows up today, accompanied by his wife who supplements the history.  I have not seen him in about 6 months.  He has a history of Parkinson's disease and associated Parkinson's disease dementia.  He is on carbidopa/levodopa 25/100, 2 tablets 3 times per day.  His memory is "horrible" per wife but he is off of aricept.  Has 24 hour/day care now as wife works.  Got a lift chair yesterday.  He has not had any hallucinations.  No lightheadedness or near syncope.  Occasionally will get up in the middle of the night to  use the restroom and will "slide" out of the bed.  Otherwise no other falls.    04/30/16 update:  Pt seen today in f/u with his wife and a caregiver who supplements the hx.  Pt on carbidopa/levodopa 25/100, 2 po tid.  Wife states "he has no memory."   Wife states patient has had no visual hallucinations but rarely he will say something about talking to his daughter who lives in [place].  However, caregiver has noted visual hallucinations.  Playing checking checkers with the air.  Has "sundowning."  Taking clothes out of drawer and throws them in the trash.  Not sleeping at night because has to go to bathroom.  Wife tried to put in cath but he took it out.  He has no falls but sometimes  slides to the floor.  He has a caregiver 5 days a week and then his wife takes care of other days.  No lightheadedness or near syncope.  He is sleeping all day and awake all night.  Cannot keep him in the bed at night.  He will pull the diaper off if they try to keep him in one.  He has trouble turning over at night.  He is in the bed about 15-16 hours per day.    09/03/16 update:  Patient seen today in follow-up for his parkinsonism, accompanied by his 2 caregivers who supplement the history.  Patient is on carbidopa/levodopa 25/100, 2 tablets 3 times per day.  We started quetiapine last visit and worked up to 50 mg at night.  We ended up stopping the titration, primarily because he was having some bladder issues that he was up all night.  He did not care for the urologist that he was seeing.  He ended up following up with his primary care physician for his urology issues.  I have reviewed those notes.  Ultimately, they decided to use nighttime Foley catheter.  That has been helpful and he is sleeping at night with that.  Hallucinations are better and the caregivers think that is because of better sleep.  Wife thought that his seroquel was for bladder and didn't want to continue but when found out for hallucinations they realized it could be helping.    12/25/16 update:  Pt seen in f/u.  Pt presents with caregiver and wife who supplement the history.  Agitation is worse, especially as the sun goes worse.  On seroquel 50 q hs.  Caregiver/wife states that "I cannot tell it helps at all."  Wife frustrated.  Some trouble with taking food in the day and just holding it in the mouth and not swallowing it.     Neuroimaging has not previously been performed.   PREVIOUS MEDICATIONS: Mirapex (first drug that was tried per wife and it caused LE edema and hallucinations; carbidopa/levodopa (thinks that it helps tremor)  ALLERGIES:   Allergies  Allergen Reactions  . Mirapex [Pramipexole Dihydrochloride] Swelling     CURRENT MEDICATIONS:  Outpatient Encounter Medications as of 12/25/2016  Medication Sig  . carbidopa-levodopa (SINEMET IR) 25-100 MG tablet TAKE 2 TABLETS BY MOUTH 3  TIMES DAILY  . Naproxen Sodium (ALEVE PO) Take by mouth 2 (two) times daily.  . QUEtiapine (SEROQUEL) 50 MG tablet TAKE 1 TABLET BY MOUTH AT  BEDTIME  . tamsulosin (FLOMAX) 0.4 MG CAPS capsule   . [DISCONTINUED] AMBULATORY NON FORMULARY MEDICATION 1 Device by Does not apply route daily. IT sales professionalTransport Chair  . [DISCONTINUED] Cholecalciferol (VITAMIN D-3 PO) Take by mouth at  bedtime.  . [DISCONTINUED] docusate sodium (COLACE) 100 MG capsule Take 100 mg by mouth daily.   . [DISCONTINUED] rosuvastatin (CRESTOR) 20 MG tablet TAKE 1 TABLET BY MOUTH  DAILY   No facility-administered encounter medications on file as of 12/25/2016.     PAST MEDICAL HISTORY:   Past Medical History:  Diagnosis Date  . 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 Enlarged prostate   . Hyperlipidemia   . Hypertension   . Parkinson's disease (HCC)     PAST SURGICAL HISTORY:   Past Surgical History:  Procedure Laterality Date  . TONSILLECTOMY      SOCIAL HISTORY:   Social History   Socioeconomic History  . Marital status: Married    Spouse name: Not on file  . Number of children: Not on file  . Years of education: Not on file  . Highest education level: Not on file  Social Needs  . Financial resource strain: Not on file  . Food insecurity - worry: Not on file  . Food insecurity - inability: Not on file  . Transportation needs - medical: Not on file  . Transportation needs - non-medical: Not on file  Occupational History  . Not on file  Tobacco Use  . Smoking status: Never Smoker  . Smokeless tobacco: Never Used  Substance and Sexual Activity  . Alcohol use: Yes    Alcohol/week: 0.0 oz    Comment: 2 glasses of wine weekly, varies  . Drug use: No  . Sexual activity: Not on file  Other Topics  Concern  . Not on file  Social History Narrative  . Not on file    FAMILY HISTORY:   Family Status  Relation Name Status  . Mother  Deceased  . Father  Deceased  . Daughter  (Not Specified)  . Daughter  (Not Specified)    ROS:  A complete 10 system review of systems was obtained and was unremarkable apart from what is mentioned above.  PHYSICAL EXAMINATION:    VITALS:   Vitals:   12/25/16 1133  BP: 130/84  Pulse: 90  SpO2: 92%   Wt Readings from Last 3 Encounters:  09/03/16 218 lb (98.9 kg)  08/13/16 221 lb (100.2 kg)  05/04/16 206 lb 3.2 oz (93.5 kg)     GEN:  The patient appears stated age and is in NAD. HEENT:  Normocephalic, atraumatic.  The mucous membranes are moist. The superficial temporal arteries are without ropiness or tenderness. CV:  RRR Lungs:  CTAB Neck/HEME:  There are no carotid bruits bilaterally.  Neurological examination:  Orientation:  Montreal Cognitive Assessment  10/31/2015 12/28/2014  Visuospatial/ Executive (0/5) 1 5  Naming (0/3) 2 2  Attention: Read list of digits (0/2) 2 2  Attention: Read list of letters (0/1) 1 1  Attention: Serial 7 subtraction starting at 100 (0/3) 3 1  Language: Repeat phrase (0/2) 2 2  Language : Fluency (0/1) 0 1  Abstraction (0/2) 2 2  Delayed Recall (0/5) 0 0  Orientation (0/6) 0 1  Total 13 17  Adjusted Score (based on education) 13 17   Cranial nerves: There is good facial symmetry. There is significant facial hypomimia.   The visual fields are full to confrontational testing. The speech is fluent and clear.  There is very little spontaneity of speech.   Soft palate rises symmetrically and there is no tongue deviation. Hearing is intact to conversational tone. Sensation: Sensation is intact to light touch  throughout. Motor: Strength is 5/5 in the bilateral upper and lower extremities.   Shoulder shrug is equal and symmetric.  There is no pronator drift.   Movement examination: Tone: There is normal  tone in the UE Abnormal movements: none Coordination:  There is decremation with RAM's, seen with finger taps, hand opening and closing, bilaterally.  There is decreased RAM's with heel taps and toe taps bilaterally.   Some of this is related to apraxia Gait and Station: not tested.  Is WC bound  ASSESSMENT/PLAN:  1.  Parkinson's disease with Parkinson's disease dementia.  -The patient will remain on carbidopa/levodopa 25/100, 2 tablets 3 times per day.  Based on visit today, I wonder if he has vascular parkinsonism, but it is rather academic now and I don't want to change medication now.  -on seroquel, 50 mg q hs.  Last visit I was told that hallucinations improved and now they are stating that seroquel never helped.  I will continue seroquel 50 mg at night at add 25 mg at 4 pm when sun goes down and then can add an additional 25 mg prn.  We did talk about the fact that the atypical antipsychotic medications are not indicated for dementia related psychosis and increased risk of mortality in the elderly, usually because of infectious or  cardiac related. Understanding is expressed and they were agreeable that the benefits outweigh the risks in this case.  -discussed advanced directives today.  Pt doesn't have any but wife states that they discussed them years ago and she is HCPOA.  She would not allow higher level interventions, including CPR.  She asks about hospice.  [facility].  I am not sure he would qualify but he certainly would for palliative.  Will have my social worker get in contact with them  2.  Urinary incontinence  -Is using Foley catheter  3.  Follow up is anticipated in the next few months, sooner should new neurologic issues arise.

## 2016-12-25 ENCOUNTER — Encounter: Payer: Self-pay | Admitting: Neurology

## 2016-12-25 ENCOUNTER — Ambulatory Visit (INDEPENDENT_AMBULATORY_CARE_PROVIDER_SITE_OTHER): Payer: Medicare Other | Admitting: Neurology

## 2016-12-25 ENCOUNTER — Encounter: Payer: Self-pay | Admitting: Psychology

## 2016-12-25 VITALS — BP 130/84 | HR 90

## 2016-12-25 DIAGNOSIS — G20 Parkinson's disease: Secondary | ICD-10-CM

## 2016-12-25 DIAGNOSIS — F0281 Dementia in other diseases classified elsewhere with behavioral disturbance: Secondary | ICD-10-CM

## 2016-12-25 DIAGNOSIS — F02818 Dementia in other diseases classified elsewhere, unspecified severity, with other behavioral disturbance: Secondary | ICD-10-CM

## 2016-12-25 NOTE — Patient Instructions (Addendum)
1.  Take seroquel - 50 mg - 1/2 tablet at 4 pm, 1 tablet at night.  You may take another 1/2 tablet as needed.  We will call you in a few weeks to see how you are doing.  Call us when seroquel needs refilled 2.  I will have my social worker call you about hospice/palliative care/respite options

## 2016-12-25 NOTE — Progress Notes (Signed)
TC to patient's wife. She is interested in the [facility] program through [facility]. I contacted [facility] and made a referral for services. They will contact me back with status of patient's enrollment after meeting with patient and his wife.

## 2016-12-30 ENCOUNTER — Other Ambulatory Visit: Payer: Self-pay | Admitting: Neurology

## 2016-12-30 MED ORDER — QUETIAPINE FUMARATE 50 MG PO TABS: ORAL_TABLET | ORAL | 1 refills | Status: DC

## 2017-01-21 DIAGNOSIS — G20 Parkinson's disease: Secondary | ICD-10-CM | POA: Diagnosis not present

## 2017-01-21 DIAGNOSIS — R159 Full incontinence of feces: Secondary | ICD-10-CM | POA: Diagnosis not present

## 2017-01-21 DIAGNOSIS — Z515 Encounter for palliative care: Secondary | ICD-10-CM | POA: Diagnosis not present

## 2017-01-21 DIAGNOSIS — R451 Restlessness and agitation: Secondary | ICD-10-CM | POA: Diagnosis not present

## 2017-01-21 DIAGNOSIS — G4700 Insomnia, unspecified: Secondary | ICD-10-CM | POA: Diagnosis not present

## 2017-01-22 ENCOUNTER — Telehealth: Payer: Self-pay | Admitting: Neurology

## 2017-01-22 NOTE — Telephone Encounter (Signed)
Talked with physician/med director of hospice.  She did not feel patient was appropriate yet for hospice.  I told her that I agreed and had written such in my note and she said that she had read my note but the wife (who works for that hospice) told her that I said that the patient was "stage 4 and no one out lives that."  I never told the patients wife that he was a hospice candidate with less than 6 months to live, although anything certainly can happen.  Hospice physician noted patient was alert, appropriate and had appropriate conversation with her and did not meet criteria at this time.  I did suggest palliative.

## 2017-01-22 NOTE — Telephone Encounter (Signed)
Dr Horace PorteousWroblewski with hospice called in regards to pt and wanted a call back from Dr Tat, she did say it was not an emergency but just wanted to speak to Dr Tat CB# [phone]

## 2017-01-22 NOTE — Telephone Encounter (Signed)
This came to me. Would you like to call them back or do you want me to find out what is needed?

## 2017-01-23 ENCOUNTER — Emergency Department (HOSPITAL_BASED_OUTPATIENT_CLINIC_OR_DEPARTMENT_OTHER): Payer: Medicare Other

## 2017-01-23 ENCOUNTER — Encounter (HOSPITAL_BASED_OUTPATIENT_CLINIC_OR_DEPARTMENT_OTHER): Payer: Self-pay | Admitting: Emergency Medicine

## 2017-01-23 ENCOUNTER — Inpatient Hospital Stay (HOSPITAL_BASED_OUTPATIENT_CLINIC_OR_DEPARTMENT_OTHER)
Admission: EM | Admit: 2017-01-23 | Discharge: 2017-01-25 | DRG: 176 | Disposition: A | Discharge status: Home / Self Care | Payer: Medicare Other | Attending: Family Medicine | Admitting: Family Medicine

## 2017-01-23 ENCOUNTER — Other Ambulatory Visit: Payer: Self-pay

## 2017-01-23 DIAGNOSIS — M7989 Other specified soft tissue disorders: Secondary | ICD-10-CM | POA: Diagnosis present

## 2017-01-23 DIAGNOSIS — Z515 Encounter for palliative care: Secondary | ICD-10-CM | POA: Diagnosis present

## 2017-01-23 DIAGNOSIS — Z8679 Personal history of other diseases of the circulatory system: Secondary | ICD-10-CM | POA: Diagnosis not present

## 2017-01-23 DIAGNOSIS — I82411 Acute embolism and thrombosis of right femoral vein: Secondary | ICD-10-CM

## 2017-01-23 DIAGNOSIS — I82401 Acute embolism and thrombosis of unspecified deep veins of right lower extremity: Secondary | ICD-10-CM | POA: Diagnosis present

## 2017-01-23 DIAGNOSIS — Z888 Allergy status to other drugs, medicaments and biological substances status: Secondary | ICD-10-CM

## 2017-01-23 DIAGNOSIS — N401 Enlarged prostate with lower urinary tract symptoms: Secondary | ICD-10-CM | POA: Diagnosis not present

## 2017-01-23 DIAGNOSIS — I2699 Other pulmonary embolism without acute cor pulmonale: Secondary | ICD-10-CM | POA: Diagnosis present

## 2017-01-23 DIAGNOSIS — G20 Parkinson's disease: Secondary | ICD-10-CM | POA: Diagnosis present

## 2017-01-23 DIAGNOSIS — G20A1 Parkinson's disease without dyskinesia, without mention of fluctuations: Secondary | ICD-10-CM | POA: Diagnosis present

## 2017-01-23 DIAGNOSIS — Z66 Do not resuscitate: Secondary | ICD-10-CM | POA: Diagnosis present

## 2017-01-23 DIAGNOSIS — D649 Anemia, unspecified: Secondary | ICD-10-CM | POA: Diagnosis present

## 2017-01-23 DIAGNOSIS — R402413 Glasgow coma scale score 13-15, at hospital admission: Secondary | ICD-10-CM | POA: Diagnosis present

## 2017-01-23 DIAGNOSIS — R600 Localized edema: Secondary | ICD-10-CM | POA: Diagnosis not present

## 2017-01-23 DIAGNOSIS — F0280 Dementia in other diseases classified elsewhere without behavioral disturbance: Secondary | ICD-10-CM | POA: Diagnosis present

## 2017-01-23 DIAGNOSIS — Z8639 Personal history of other endocrine, nutritional and metabolic disease: Secondary | ICD-10-CM | POA: Diagnosis not present

## 2017-01-23 DIAGNOSIS — R351 Nocturia: Secondary | ICD-10-CM | POA: Diagnosis not present

## 2017-01-23 DIAGNOSIS — N400 Enlarged prostate without lower urinary tract symptoms: Secondary | ICD-10-CM | POA: Diagnosis present

## 2017-01-23 DIAGNOSIS — Z79899 Other long term (current) drug therapy: Secondary | ICD-10-CM

## 2017-01-23 DIAGNOSIS — I2692 Saddle embolus of pulmonary artery without acute cor pulmonale: Secondary | ICD-10-CM | POA: Diagnosis not present

## 2017-01-23 DIAGNOSIS — F0390 Unspecified dementia without behavioral disturbance: Secondary | ICD-10-CM | POA: Diagnosis not present

## 2017-01-23 DIAGNOSIS — Z791 Long term (current) use of non-steroidal anti-inflammatories (NSAID): Secondary | ICD-10-CM

## 2017-01-23 LAB — CBC WITH DIFFERENTIAL/PLATELET
BASOS ABS: 0 10*3/uL (ref 0.0–0.1)
Basophils Relative: 0 %
EOS ABS: 0.3 10*3/uL (ref 0.0–0.7)
EOS PCT: 6 %
HCT: 36.1 % — ABNORMAL LOW (ref 39.0–52.0)
Hemoglobin: 12.2 g/dL — ABNORMAL LOW (ref 13.0–17.0)
Lymphocytes Relative: 17 %
Lymphs Abs: 0.7 10*3/uL (ref 0.7–4.0)
MCH: 32.1 pg (ref 26.0–34.0)
MCHC: 33.8 g/dL (ref 30.0–36.0)
MCV: 95 fL (ref 78.0–100.0)
Monocytes Absolute: 0.4 10*3/uL (ref 0.1–1.0)
Monocytes Relative: 10 %
Neutro Abs: 2.7 10*3/uL (ref 1.7–7.7)
Neutrophils Relative %: 67 %
PLATELETS: 195 10*3/uL (ref 150–400)
RBC: 3.8 MIL/uL — AB (ref 4.22–5.81)
RDW: 14.9 % (ref 11.5–15.5)
WBC: 4.1 10*3/uL (ref 4.0–10.5)

## 2017-01-23 LAB — BASIC METABOLIC PANEL
Anion gap: 7 (ref 5–15)
BUN: 35 mg/dL — AB (ref 6–20)
CO2: 24 mmol/L (ref 22–32)
CREATININE: 1.12 mg/dL (ref 0.61–1.24)
Calcium: 8.7 mg/dL — ABNORMAL LOW (ref 8.9–10.3)
Chloride: 105 mmol/L (ref 101–111)
GFR calc Af Amer: >60 mL/min (ref >60)
GFR, EST NON AFRICAN AMERICAN: 60 mL/min — AB (ref >60)
Glucose, Bld: 96 mg/dL (ref 65–99)
Potassium: 3.8 mmol/L (ref 3.5–5.1)
Sodium: 136 mmol/L (ref 135–145)

## 2017-01-23 LAB — PROTIME-INR
INR: 1.03
PROTHROMBIN TIME: 13.4 s (ref 11.4–15.2)

## 2017-01-23 LAB — TROPONIN I

## 2017-01-23 MED ORDER — HEPARIN (PORCINE) IN NACL 100-0.45 UNIT/ML-% IJ SOLN
1500.0000 [IU]/h | INTRAMUSCULAR | Status: AC
Start: 2017-01-23 — End: 2017-01-25
  Administered 2017-01-23 – 2017-01-25 (×3): 1600 [IU]/h via INTRAVENOUS
  Filled 2017-01-23 (×3): qty 250

## 2017-01-23 MED ORDER — IOPAMIDOL (ISOVUE-370) INJECTION 76%
100.0000 mL | Freq: Once | INTRAVENOUS | Status: AC | PRN
  Administered 2017-01-23: 100 mL via INTRAVENOUS

## 2017-01-23 MED ORDER — HEPARIN BOLUS VIA INFUSION
5000.0000 [IU] | Freq: Once | INTRAVENOUS | Status: AC
  Administered 2017-01-23: 5000 [IU] via INTRAVENOUS

## 2017-01-23 NOTE — Plan of Care (Signed)
Plan of care  Received telephone call from Dr. Madilyn Hookees via [facility].  Patient is an 84 year old with Parkinson's Disease presenting with DOE and R LE edema found to have DVT and PE.  Started on hep gtt.  Accepted to hospital medicine service for further management.  Laurell RoofPatrick Avion Kutzer, MD

## 2017-01-23 NOTE — ED Triage Notes (Signed)
Patient has had increased swelling to his right leg and foot for the last 2 -5 days. Patient is here to have the redness and the swelling evaluated

## 2017-01-23 NOTE — ED Notes (Signed)
Heparin verified by Maury Dusonna RN

## 2017-01-23 NOTE — Progress Notes (Signed)
ANTICOAGULATION CONSULT NOTE   Pharmacy Consult for heparin  Indication: pulmonary embolus  Allergies  Allergen Reactions  . Mirapex [Pramipexole Dihydrochloride] Swelling    Patient Measurements: Weight: 218 lb (98.9 kg)  Vital Signs: Temp: 98.6 F (37 C) (01/05 1758) Temp Source: Oral (01/05 1758) BP: 172/98 (01/05 2200) Pulse Rate: 80 (01/05 2200)  Labs: Recent Labs    01/23/17 1916 01/23/17 2104  HGB 12.2*  --   HCT 36.1*  --   PLT 195  --   LABPROT 13.4  --   INR 1.03  --   CREATININE 1.12  --   TROPONINI  --  <0.03    Estimated Creatinine Clearance: 61 mL/min (by C-G formula based on SCr of 1.12 mg/dL).   Medical History: Past Medical History:  Diagnosis Date  . 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 Enlarged prostate   . Hyperlipidemia   . Hypertension   . Parkinson's disease [facility](HCC)     Assessment: 84 yo male to start heparin infusion for acute PE. No known a/c PTA. CBC stable.    Goal of Therapy:  Heparin level 0.3-0.7 units/ml Monitor platelets by anticoagulation protocol: Yes   Plan:  1. Heparin 5000 unit bolus 2. Heparin infusion at 1600 units/hr  3. Heparin level in 8 hours and daily   Pollyann SamplesAndy Lunetta Marina, PharmD, BCPS 01/23/2017, 10:22 PM

## 2017-01-23 NOTE — ED Notes (Signed)
Patient transported to CT 

## 2017-01-23 NOTE — ED Provider Notes (Addendum)
[facility] EMERGENCY DEPARTMENT Provider Note   CSN: [id number] Arrival date & time: 01/23/17  1739     History   Chief Complaint Chief Complaint  Patient presents with  . Leg Pain    HPI Jacob Tran is a 84 y.o. male.  The history is provided by the patient and the spouse. No language interpreter was used.  Leg Pain      Jacob Tran is a 84 y.o. male who presents to the Emergency Department complaining of leg swelling.  Level 5 caveat due to dementia.  History is provided by the wife and family.  He has had increased right lower extremity edema for the last 3 days.  No reports of injury.  His wife notes that he has had increased shortness of breath with dyspnea on exertion and occasional panic attacks that have been ongoing for the last several months.  No fevers, vomiting, diarrhea.  He has severe Parkinson's and is on palliative care at home.  Past Medical History:  Diagnosis Date  . 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 Enlarged prostate   . Hyperlipidemia   . Hypertension   . Parkinson's disease [facility](HCC)     Patient Active Problem List   Diagnosis Date Noted  . Pulmonary embolism (HCC) 01/23/2017  . Parkinson's disease dementia (HCC) 09/17/2014  . BPH (benign prostatic hyperplasia) 09/17/2014  . Parkinson's disease (HCC) 09/17/2014    Past Surgical History:  Procedure Laterality Date  . TONSILLECTOMY         Home Medications    Prior to Admission medications   Medication Sig Start Date End Date Taking? Authorizing Provider  carbidopa-levodopa (SINEMET IR) 25-100 MG tablet TAKE 2 TABLETS BY MOUTH 3  TIMES DAILY 06/22/16   Tat, Octaviano Battyebecca S, DO  Naproxen Sodium (ALEVE PO) Take by mouth 2 (two) times daily.    [provider]  QUEtiapine (SEROQUEL) 50 MG tablet 1/2 at 4 pm, 1 at bedtime, 1/2 PRN 12/30/16   Tat, Octaviano Battyebecca S, DO  tamsulosin (FLOMAX) 0.4 MG CAPS capsule  07/14/16   [provider]     Family History Family History  Problem Relation Age of Onset  . Lupus Daughter   . Healthy Daughter        x2    Social History Social History   Tobacco Use  . Smoking status: Never Smoker  . Smokeless tobacco: Never Used  Substance Use Topics  . Alcohol use: Yes    Alcohol/week: 0.0 oz    Comment: 2 glasses of wine weekly, varies  . Drug use: No     Allergies   Mirapex [pramipexole dihydrochloride]   Review of Systems Review of Systems  All other systems reviewed and are negative.    Physical Exam Updated Vital Signs BP (!) 155/87   Pulse 85   Temp 98.6 F (37 C) (Oral)   Resp (!) 21   Wt 98.9 kg (218 lb)   SpO2 97%   BMI 31.28 kg/m   Physical Exam  Constitutional: He appears well-developed and well-nourished.  HENT:  Head: Normocephalic and atraumatic.  Cardiovascular: Normal rate and regular rhythm.  No murmur heard. Pulmonary/Chest: Effort normal and breath sounds normal. No respiratory distress.  Abdominal: Soft. There is no tenderness. There is no rebound and no guarding.  Musculoskeletal: He exhibits no tenderness.  Nonpitting edema and fullness to the right calf and ankle  Neurological: He is alert.  Slow to answer questions.  Mildly  confused.  Disoriented to time.  5 out of 5 strength in all 4 extremities  Skin: Skin is warm and dry.  Psychiatric: He has a normal mood and affect. His behavior is normal.  Nursing note and vitals reviewed.    ED Treatments / Results  Labs (all labs ordered are listed, but only abnormal results are displayed) Labs Reviewed  BASIC METABOLIC PANEL - Abnormal; Notable for the following components:      Result Value   BUN 35 (*)    Calcium 8.7 (*)    GFR calc non Af Amer 60 (*)    All other components within normal limits  CBC WITH DIFFERENTIAL/PLATELET - Abnormal; Notable for the following components:   RBC 3.80 (*)    Hemoglobin 12.2 (*)    HCT 36.1 (*)    All other components within normal limits   PROTIME-INR  TROPONIN I  HEPARIN LEVEL (UNFRACTIONATED)    EKG  EKG Interpretation  Date/Time:  Saturday January 23 2017 21:04:31 EST Ventricular Rate:  93 PR Interval:    QRS Duration: 105 QT Interval:  347 QTC Calculation: 432 R Axis:   59 Text Interpretation:  Sinus rhythm Consider right atrial enlargement Artifact in lead(s) I III aVR aVL aVF Confirmed by Tilden Fossa [phone]) on 01/23/2017 11:11:21 PM       Radiology Ct Angio Chest Pe W/cm &/or Wo Cm  Result Date: 01/23/2017 CLINICAL DATA:  Chest pain increasing shortness of breath. Parkinson's and dementia. Difficulty following commands. EXAM: CT ANGIOGRAPHY CHEST WITH CONTRAST TECHNIQUE: Multidetector CT imaging of the chest was performed using the standard protocol during bolus administration of intravenous contrast. Multiplanar CT image reconstructions and MIPs were obtained to evaluate the vascular anatomy. CONTRAST:  ISOVUE-370 IOPAMIDOL (ISOVUE-370) INJECTION 76% COMPARISON:  None. FINDINGS: Cardiovascular: Large filling defect in the distal right main pulmonary artery extending into several upper and lower lobe branches consistent with acute pulmonary embolus. RV to LV ratio is 0.81. This suggests right heart strain to be unlikely. Normal heart size. Small pericardial effusion. Normal caliber thoracic aorta. Calcification of the aorta and coronary artery's. Great vessel origins are patent. Mediastinum/Nodes: No enlarged mediastinal, hilar, or axillary lymph nodes. Thyroid gland, trachea, and esophagus demonstrate no significant findings. Lungs/Pleura: Evaluation is limited due to motion artifact throughout. There is evidence of atelectasis in the lung bases. No definite consolidation or edema. No pleural effusions. No pneumothorax. Upper Abdomen: No acute process demonstrated in the upper abdominal contents visualized. Fatty infiltration of the pancreas. Vascular calcifications. Musculoskeletal: Degenerative changes  throughout the spine. No destructive bone lesions. Review of the MIP images confirms the above findings. IMPRESSION: 1. Positive examination for large right main and multiple segmental right pulmonary artery emboli. No evidence of right heart strain. 2. Small pericardial effusion. 3. Atelectasis in the lung bases. These results were called by telephone at the time of interpretation on 01/23/2017 at 10:06 pm to Dr. Tilden Fossa , who verbally acknowledged these results. Aortic Atherosclerosis (ICD10-I70.0). Electronically Signed   By: Burman Nieves M.D.   On: 01/23/2017 22:08   US Venous Img Lower Unilateral Right  Result Date: 01/23/2017 CLINICAL DATA:  Right leg swelling for 4 days. Edema. Parkinson's disease. Patient is sedentary. EXAM: RIGHT LOWER EXTREMITY VENOUS DOPPLER ULTRASOUND TECHNIQUE: Gray-scale sonography with graded compression, as well as color Doppler and duplex ultrasound were performed to evaluate the lower extremity deep venous systems from the level of the common femoral vein and including the common femoral, femoral, profunda  femoral, popliteal and calf veins including the posterior tibial, peroneal and gastrocnemius veins when visible. The superficial great saphenous vein was also interrogated. Spectral Doppler was utilized to evaluate flow at rest and with distal augmentation maneuvers in the common femoral, femoral and popliteal veins. COMPARISON:  None. FINDINGS: Contralateral Common Femoral Vein: Respiratory phasicity is normal and symmetric with the symptomatic side. No evidence of thrombus. Normal compressibility. Common Femoral Vein: No evidence of thrombus. Normal compressibility, respiratory phasicity and response to augmentation. Saphenofemoral Junction: No evidence of thrombus. Normal compressibility and flow on color Doppler imaging. Profunda Femoral Vein: No evidence of thrombus. Normal compressibility and flow on color Doppler imaging. Femoral Vein: There is nonocclusive  thrombus beginning in the mid right femoral vein. In the distal femoral vein, there is occlusive thrombus. Popliteal Vein: Occlusive thrombus.  The vein is noncompressible. Calf Veins: Occlusive thrombus.  Veins are noncompressible. Superficial Great Saphenous Vein: No evidence of thrombus. Normal compressibility. Venous Reflux:  Not visualized Other Findings: In the right medial popliteal fossa, an anechoic and hypoechoic collection is 9.8 x 4.6 x 6.7 cm. IMPRESSION: 1. Occlusive thrombus in the distal right femoral vein, extending into the popliteal vein and calf veins. 2. Right popliteal fossa fluid collection. Electronically Signed   By: Norva Pavlov M.D.   On: 01/23/2017 19:42    Procedures Procedures (including critical care time) CRITICAL CARE Performed by: Tilden Fossa   Total critical care time: 30 minutes  Critical care time was exclusive of separately billable procedures and treating other patients.  Critical care was necessary to treat or prevent imminent or life-threatening deterioration.  Critical care was time spent personally by me on the following activities: development of treatment plan with patient and/or surrogate as well as nursing, discussions with consultants, evaluation of patient's response to treatment, examination of patient, obtaining history from patient or surrogate, ordering and performing treatments and interventions, ordering and review of laboratory studies, ordering and review of radiographic studies, pulse oximetry and re-evaluation of patient's condition.  Medications Ordered in ED Medications  heparin ADULT infusion 100 units/mL (25000 units/243mL sodium chloride 0.45%) (1,600 Units/hr Intravenous Transfusing/Transfer 01/23/17 2350)  iopamidol (ISOVUE-370) 76 % injection 100 mL (100 mLs Intravenous Contrast Given 01/23/17 2121)  heparin bolus via infusion 5,000 Units (5,000 Units Intravenous Bolus from Bag 01/23/17 2258)     Initial Impression /  Assessment and Plan / ED Course  I have reviewed the triage vital signs and the nursing notes.  Pertinent labs & imaging results that were available during my care of the patient were reviewed by me and considered in my medical decision making (see chart for details).    Patient here for evaluation of lower extremity edema as well as shortness of breath.  He acute DVT of the right lower extremity.  CTA obtained with large right-sided pulmonary embolism.  He was started on heparin drip.  Medicine consulted for admission for observation given acute PE and DVT.  Patient is a fall risk given his history of Parkinson's.  Patient and wife updated of findings of studies and recommendation for admission and they are in agreement with treatment plan.  Final Clinical Impressions(s) / ED Diagnoses   Final diagnoses:  None    ED Discharge Orders    None       Tilden Fossa, MD 01/24/17 Marlyce Huge    Tilden Fossa, MD 02/09/17 [phone]

## 2017-01-24 ENCOUNTER — Encounter (HOSPITAL_COMMUNITY): Payer: Self-pay | Admitting: Internal Medicine

## 2017-01-24 DIAGNOSIS — R351 Nocturia: Secondary | ICD-10-CM

## 2017-01-24 DIAGNOSIS — I2692 Saddle embolus of pulmonary artery without acute cor pulmonale: Secondary | ICD-10-CM

## 2017-01-24 DIAGNOSIS — N401 Enlarged prostate with lower urinary tract symptoms: Secondary | ICD-10-CM

## 2017-01-24 DIAGNOSIS — G20 Parkinson's disease: Secondary | ICD-10-CM

## 2017-01-24 LAB — CBC
HCT: 35.1 % — ABNORMAL LOW (ref 39.0–52.0)
Hemoglobin: 11.8 g/dL — ABNORMAL LOW (ref 13.0–17.0)
MCH: 31.6 pg (ref 26.0–34.0)
MCHC: 33.6 g/dL (ref 30.0–36.0)
MCV: 94.1 fL (ref 78.0–100.0)
PLATELETS: 186 10*3/uL (ref 150–400)
RBC: 3.73 MIL/uL — AB (ref 4.22–5.81)
RDW: 14.9 % (ref 11.5–15.5)
WBC: 4.6 10*3/uL (ref 4.0–10.5)

## 2017-01-24 LAB — COMPREHENSIVE METABOLIC PANEL
ALK PHOS: 89 U/L (ref 38–126)
ALT: 16 U/L — ABNORMAL LOW (ref 17–63)
ANION GAP: 6 (ref 5–15)
AST: 17 U/L (ref 15–41)
Albumin: 3.2 g/dL — ABNORMAL LOW (ref 3.5–5.0)
BUN: 28 mg/dL — ABNORMAL HIGH (ref 6–20)
CALCIUM: 8.6 mg/dL — AB (ref 8.9–10.3)
CHLORIDE: 107 mmol/L (ref 101–111)
CO2: 23 mmol/L (ref 22–32)
Creatinine, Ser: 1.02 mg/dL (ref 0.61–1.24)
GFR calc non Af Amer: >60 mL/min (ref >60)
GLUCOSE: 110 mg/dL — AB (ref 65–99)
POTASSIUM: 3.6 mmol/L (ref 3.5–5.1)
SODIUM: 136 mmol/L (ref 135–145)
Total Bilirubin: 0.6 mg/dL (ref 0.3–1.2)
Total Protein: 6.1 g/dL — ABNORMAL LOW (ref 6.5–8.1)

## 2017-01-24 LAB — HEPARIN LEVEL (UNFRACTIONATED): HEPARIN UNFRACTIONATED: 0.38 [IU]/mL (ref 0.30–0.70)

## 2017-01-24 LAB — PSA: PROSTATIC SPECIFIC ANTIGEN: 2.41 ng/mL (ref 0.00–4.00)

## 2017-01-24 LAB — TROPONIN I: Troponin I: <0.03 ng/mL (ref <0.03)

## 2017-01-24 MED ORDER — SODIUM CHLORIDE 0.9% FLUSH: 3.0000 mL | INTRAVENOUS | Status: DC | PRN

## 2017-01-24 MED ORDER — CARBIDOPA-LEVODOPA 25-100 MG PO TABS
2.0000 | ORAL_TABLET | Freq: Three times a day (TID) | ORAL | Status: DC
  Administered 2017-01-24 – 2017-01-25 (×6): 2 via ORAL
  Filled 2017-01-24 (×6): qty 2

## 2017-01-24 MED ORDER — POLYETHYLENE GLYCOL 3350 17 G PO PACK
17.0000 g | PACK | ORAL | Status: DC
  Administered 2017-01-25: 17 g via ORAL
  Filled 2017-01-24: qty 1

## 2017-01-24 MED ORDER — SODIUM CHLORIDE 0.9% FLUSH
3.0000 mL | Freq: Two times a day (BID) | INTRAVENOUS | Status: DC
  Administered 2017-01-24 (×2): 3 mL via INTRAVENOUS

## 2017-01-24 MED ORDER — ACETAMINOPHEN 325 MG PO TABS: 650.0000 mg | ORAL_TABLET | Freq: Four times a day (QID) | ORAL | Status: DC | PRN

## 2017-01-24 MED ORDER — ACETAMINOPHEN 650 MG RE SUPP: 650.0000 mg | Freq: Four times a day (QID) | RECTAL | Status: DC | PRN

## 2017-01-24 MED ORDER — SODIUM CHLORIDE 0.9 % IV SOLN: 250.0000 mL | INTRAVENOUS | Status: DC | PRN

## 2017-01-24 MED ORDER — QUETIAPINE FUMARATE 25 MG PO TABS
25.0000 mg | ORAL_TABLET | ORAL | Status: DC
  Administered 2017-01-24 – 2017-01-25 (×2): 25 mg via ORAL
  Filled 2017-01-24 (×2): qty 1

## 2017-01-24 MED ORDER — QUETIAPINE FUMARATE 25 MG PO TABS: 25.0000 mg | ORAL_TABLET | Freq: Two times a day (BID) | ORAL | Status: DC

## 2017-01-24 MED ORDER — QUETIAPINE FUMARATE 25 MG PO TABS
25.0000 mg | ORAL_TABLET | Freq: Every day | ORAL | Status: DC | PRN
  Administered 2017-01-25: 25 mg via ORAL
  Filled 2017-01-24: qty 1

## 2017-01-24 MED ORDER — SODIUM CHLORIDE 0.9 % IV SOLN
INTRAVENOUS | Status: DC
  Administered 2017-01-24: 03:00:00 via INTRAVENOUS

## 2017-01-24 MED ORDER — QUETIAPINE FUMARATE 50 MG PO TABS
50.0000 mg | ORAL_TABLET | Freq: Every day | ORAL | Status: DC
  Administered 2017-01-24 (×2): 50 mg via ORAL
  Filled 2017-01-24 (×2): qty 1

## 2017-01-24 NOTE — H&P (Signed)
TR[facility] H&P   Patient Demographics:    Jacob Tran, is a 84 y.o. male  MRN: [id number]   DOB - 02/27/1933  Admit Date - 01/23/2017  Outpatient Primary MD for the patient is Carmelia Roller, Jilda Roche, DO  Referring MD/NP/PA:  Tilden Fossa  Outpatient Specialists:   Patient coming from: home  Chief Complaint  Patient presents with  . Leg Pain      HPI:    Jacob Tran  is a 84 y.o. male, w parkinsons, bph who presents with dyspnea on exertion and RLE edema (noted on Thursday), and was worse today and therefore presented to ED.     In ED,  Na 136, K 3.8, Bun 35, Creatinine 1.12  Wbc 4.1, Hgb 12.2, Plt 195  INR 1.03  Trop <0.03  CTA chest   IMPRESSION: 1. Positive examination for large right main and multiple segmental right pulmonary artery emboli. No evidence of right heart strain. 2. Small pericardial effusion. 3. Atelectasis in the lung bases.  Pt started on heparin iv  Pt will be admitted for pulmonary embolism and DVT      Review of systems:    In addition to the HPI above,    No Fever-chills, No Headache, No changes with Vision or hearing, No problems swallowing food or Liquids, No Chest pain, Cough  No Abdominal pain, No Nausea or Vommitting, Bowel movements are regular, No Blood in stool or Urine, No dysuria, No new skin rashes or bruises, No new joints pains-aches,  No new weakness, tingling, numbness in any extremity, No recent weight gain or loss, No polyuria, polydypsia or polyphagia, No significant Mental Stressors.  A full 10 point Review of Systems was done, except as stated above, all other Review of Systems were negative.   With Past History of the following :    Past Medical History:  Diagnosis Date  . 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 Enlarged prostate   . Hyperlipidemia   .  Hypertension   . Parkinson's disease [facility])       Past Surgical History:  Procedure Laterality Date  . TONSILLECTOMY        Social History:     Social History   Tobacco Use  . Smoking status: Never Smoker  . Smokeless tobacco: Never Used  Substance Use Topics  . Alcohol use: Yes    Alcohol/week: 0.0 oz    Comment: 2 glasses of wine weekly, varies     Lives - at home  Mobility - walks by self   Family History :     Family History  Problem Relation Age of Onset  . Lupus Daughter   . Healthy Daughter        x2      Home Medications:   Prior to Admission medications   Medication Sig Start Date End Date Taking? Authorizing  Provider  carbidopa-levodopa (SINEMET IR) 25-100 MG tablet TAKE 2 TABLETS BY MOUTH 3  TIMES DAILY 06/22/16   Tat, Octaviano Batty, DO  Naproxen Sodium (ALEVE PO) Take by mouth 2 (two) times daily.    [provider]  QUEtiapine (SEROQUEL) 50 MG tablet 1/2 at 4 pm, 1 at bedtime, 1/2 PRN 12/30/16   Tat, Octaviano Batty, DO  tamsulosin (FLOMAX) 0.4 MG CAPS capsule  07/14/16   [provider]     Allergies:     Allergies  Allergen Reactions  . Mirapex [Pramipexole Dihydrochloride] Swelling     Physical Exam:   Vitals  Blood pressure (!) 155/87, pulse 85, temperature 98.6 F (37 C), temperature source Oral, resp. rate (!) 21, weight 98.9 kg (218 lb), SpO2 97 %.   1. General  lying in bed in NAD,    2. Normal affect and insight, Not Suicidal or Homicidal, Awake Alert, Oriented X 3.  3. No F.N deficits, ALL C.Nerves Intact, Strength 5/5 all 4 extremities, Sensation intact all 4 extremities, Plantars down going.  4. Ears and Eyes appear Normal, Conjunctivae clear, PERRLA. Moist Oral Mucosa.  5. Supple Neck, No JVD, No cervical lymphadenopathy appriciated, No Carotid Bruits.  6. Symmetrical Chest wall movement, Good air movement bilaterally, CTAB.  7. RRR, No Gallops, Rubs or Murmurs, No Parasternal Heave.  8. Positive Bowel  Sounds, Abdomen Soft, No tenderness, No organomegaly appriciated,No rebound -guarding or rigidity.  9.  No Cyanosis,RLE swelling, venous stasis changes  10. Good muscle tone,  joints appear normal , no effusions, Normal ROM.  11. No Palpable Lymph Nodes in Neck or Axillae  Bradykinesia   Data Review:    CBC Recent Labs  Lab 01/23/17 1916  WBC 4.1  HGB 12.2*  HCT 36.1*  PLT 195  MCV 95.0  MCH 32.1  MCHC 33.8  RDW 14.9  LYMPHSABS 0.7  MONOABS 0.4  EOSABS 0.3  BASOSABS 0.0   ------------------------------------------------------------------------------------------------------------------  Chemistries  Recent Labs  Lab 01/23/17 1916  NA 136  K 3.8  CL 105  CO2 24  GLUCOSE 96  BUN 35*  CREATININE 1.12  CALCIUM 8.7*   ------------------------------------------------------------------------------------------------------------------ estimated creatinine clearance is 61 mL/min (by C-G formula based on SCr of 1.12 mg/dL). ------------------------------------------------------------------------------------------------------------------ No results for input(s): TSH, T4TOTAL, T3FREE, THYROIDAB in the last 72 hours.  Invalid input(s): FREET3  Coagulation profile Recent Labs  Lab 01/23/17 1916  INR 1.03   ------------------------------------------------------------------------------------------------------------------- No results for input(s): DDIMER in the last 72 hours. -------------------------------------------------------------------------------------------------------------------  Cardiac Enzymes Recent Labs  Lab 01/23/17 2104  TROPONINI <0.03   ------------------------------------------------------------------------------------------------------------------ No results found for: BNP   ---------------------------------------------------------------------------------------------------------------  Urinalysis    Component Value Date/Time   BILIRUBINUR  neg 11/05/2015 1729   PROTEINUR positive 1+ 11/05/2015 1729   UROBILINOGEN 0.2 11/05/2015 1729   NITRITE neg 11/05/2015 1729   LEUKOCYTESUR Negative 11/05/2015 1729    ----------------------------------------------------------------------------------------------------------------   Imaging Results:    Ct Angio Chest Pe W/cm &/or Wo Cm  Result Date: 01/23/2017 CLINICAL DATA:  Chest pain increasing shortness of breath. Parkinson's and dementia. Difficulty following commands. EXAM: CT ANGIOGRAPHY CHEST WITH CONTRAST TECHNIQUE: Multidetector CT imaging of the chest was performed using the standard protocol during bolus administration of intravenous contrast. Multiplanar CT image reconstructions and MIPs were obtained to evaluate the vascular anatomy. CONTRAST:  ISOVUE-370 IOPAMIDOL (ISOVUE-370) INJECTION 76% COMPARISON:  None. FINDINGS: Cardiovascular: Large filling defect in the distal right main pulmonary artery extending into several upper and lower lobe branches consistent  with acute pulmonary embolus. RV to LV ratio is 0.81. This suggests right heart strain to be unlikely. Normal heart size. Small pericardial effusion. Normal caliber thoracic aorta. Calcification of the aorta and coronary artery's. Great vessel origins are patent. Mediastinum/Nodes: No enlarged mediastinal, hilar, or axillary lymph nodes. Thyroid gland, trachea, and esophagus demonstrate no significant findings. Lungs/Pleura: Evaluation is limited due to motion artifact throughout. There is evidence of atelectasis in the lung bases. No definite consolidation or edema. No pleural effusions. No pneumothorax. Upper Abdomen: No acute process demonstrated in the upper abdominal contents visualized. Fatty infiltration of the pancreas. Vascular calcifications. Musculoskeletal: Degenerative changes throughout the spine. No destructive bone lesions. Review of the MIP images confirms the above findings. IMPRESSION: 1. Positive examination  for large right main and multiple segmental right pulmonary artery emboli. No evidence of right heart strain. 2. Small pericardial effusion. 3. Atelectasis in the lung bases. These results were called by telephone at the time of interpretation on 01/23/2017 at 10:06 pm to Dr. Tilden Fossa , who verbally acknowledged these results. Aortic Atherosclerosis (ICD10-I70.0). Electronically Signed   By: Burman Nieves M.D.   On: 01/23/2017 22:08   US Venous Img Lower Unilateral Right  Result Date: 01/23/2017 CLINICAL DATA:  Right leg swelling for 4 days. Edema. Parkinson's disease. Patient is sedentary. EXAM: RIGHT LOWER EXTREMITY VENOUS DOPPLER ULTRASOUND TECHNIQUE: Gray-scale sonography with graded compression, as well as color Doppler and duplex ultrasound were performed to evaluate the lower extremity deep venous systems from the level of the common femoral vein and including the common femoral, femoral, profunda femoral, popliteal and calf veins including the posterior tibial, peroneal and gastrocnemius veins when visible. The superficial great saphenous vein was also interrogated. Spectral Doppler was utilized to evaluate flow at rest and with distal augmentation maneuvers in the common femoral, femoral and popliteal veins. COMPARISON:  None. FINDINGS: Contralateral Common Femoral Vein: Respiratory phasicity is normal and symmetric with the symptomatic side. No evidence of thrombus. Normal compressibility. Common Femoral Vein: No evidence of thrombus. Normal compressibility, respiratory phasicity and response to augmentation. Saphenofemoral Junction: No evidence of thrombus. Normal compressibility and flow on color Doppler imaging. Profunda Femoral Vein: No evidence of thrombus. Normal compressibility and flow on color Doppler imaging. Femoral Vein: There is nonocclusive thrombus beginning in the mid right femoral vein. In the distal femoral vein, there is occlusive thrombus. Popliteal Vein: Occlusive thrombus.   The vein is noncompressible. Calf Veins: Occlusive thrombus.  Veins are noncompressible. Superficial Great Saphenous Vein: No evidence of thrombus. Normal compressibility. Venous Reflux:  Not visualized Other Findings: In the right medial popliteal fossa, an anechoic and hypoechoic collection is 9.8 x 4.6 x 6.7 cm. IMPRESSION: 1. Occlusive thrombus in the distal right femoral vein, extending into the popliteal vein and calf veins. 2. Right popliteal fossa fluid collection. Electronically Signed   By: Norva Pavlov M.D.   On: 01/23/2017 19:42      Assessment & Plan:    Principal Problem:   Pulmonary embolism (HCC) Active Problems:   BPH (benign prostatic hyperplasia)   Parkinson's disease (HCC)    PE, RLE DVT Check PTT Check Psa Check lupus anticoagulant (since daughter has Lupus) Check cardiac echo Heparin iv pharmacy to dose Family thinks he is not a fall risk  Bph Not currently on Flomax due to difficulty to swallow Catheter in place  Parknisons Cont Sinemet IR 25/100mg  2 po tid Cont Seroquel 50mg  1/2 po q4pm and qhs prn   Anemia Check cbc in  am  DVT Prophylaxis Heparin -   SCDs  AM Labs Ordered, also please review Full Orders  Family Communication: Admission, patients condition and plan of care including tests being ordered have been discussed with the patient and who indicate understanding and agree with the plan and Code Status.  Code Status  DNR  Likely DC to  home  Condition GUARDED    Consults called:  none  Admission status: inpatient  Time spent in minutes : 45   Pearson GrippeJames Evalynn Hankins M.D on 01/24/2017 at 1:16 AM  Between 7am to 7pm - Pager - [phone]. After 7pm go to [email or web address] - password [email or web address]  [facility] - Office  [phone]

## 2017-01-24 NOTE — Progress Notes (Signed)
Patients wife is requesting for sinamet to be given 20 min prior to meals

## 2017-01-24 NOTE — Progress Notes (Signed)
New Admission Note:  Arrival Method: Via [facility] on [facility], general practicestretcher.  Mental Orientation: Alert & Oriented x1 Telemetry: CCMD verified Assessment: Completed Skin: Refer to flowsheet IV: Right AC & Right Forearm Pain: 0/10 Tubes: Safety Measures: Safety Fall Prevention Plan discussed with patient. Admission: Completed [facility] [place] Orientation: Patient has been orientated to the room, unit and the staff.  Orders have been reviewed and implemented. Will continue to monitor the patient. Call light has been placed within reach and bed alarm has been activated.   Aram CandelaJequetta Aleda Madl, RN  Phone Number: [phone]

## 2017-01-24 NOTE — Progress Notes (Signed)
ANTICOAGULATION CONSULT NOTE   Pharmacy Consult for heparin  Indication: pulmonary embolus  Allergies  Allergen Reactions  . Mirapex [Pramipexole Dihydrochloride] Swelling    Patient Measurements: Weight: 219 lb 12.8 oz (99.7 kg)  Vital Signs: Temp: 98.5 F (36.9 C) (01/06 0537) Temp Source: Oral (01/06 0537) BP: 157/64 (01/06 0537) Pulse Rate: 89 (01/06 0537)  Labs: Recent Labs    01/23/17 1916 01/23/17 2104 01/24/17 0300 01/24/17 0704  HGB 12.2*  --  11.8*  --   HCT 36.1*  --  35.1*  --   PLT 195  --  186  --   LABPROT 13.4  --   --   --   INR 1.03  --   --   --   HEPARINUNFRC  --   --   --  0.38  CREATININE 1.12  --  1.02  --   TROPONINI  --  <0.03 <0.03 <0.03    Estimated Creatinine Clearance: 67.2 mL/min (by C-G formula based on SCr of 1.02 mg/dL).   Medical History: Past Medical History:  Diagnosis Date  . 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 Enlarged prostate   . Hyperlipidemia   . Hypertension   . Parkinson's disease [facility](HCC)     Assessment: [age] yo male to start heparin infusion for acute PE. No known a/c PTA. CBC stable.   HL therapeutic at 0.38 on 1600 units/hr.  Goal of Therapy:  Heparin level 0.3-0.7 units/ml Monitor platelets by anticoagulation protocol: Yes   Plan:  1. Continue heparin infusion at 1600 units/hr  2. Heparin level and CBC daily   Ladell PierBrooke Joua Bake, PharmD Pharmacy Resident [phone] 01/24/2017 9:02 AM

## 2017-01-24 NOTE — Progress Notes (Signed)
This is a no charge note.  For full details, please see H&P by my partner Dr. Selena BattenKim from earlier today. Patient was seen and examined.  Presented with leg swelling, found to have right DVT and large PE without RV strain by CT criteria, also hemodynamically stable.  Has Parkinson's and advanced dementia at baseline, but lives with wife at home, with caregiver support.    PE -Continue heparin gtt -Obtain echo -CM consult for NOAC coverage, wife thinks Xarelto -PT eval tomorrow and likely discharge tomorrow to home or SNF with Palliative continuing to follow as outpatient   Parkinsons -Continue Sinemet, Seroquel

## 2017-01-25 ENCOUNTER — Inpatient Hospital Stay (HOSPITAL_COMMUNITY): Payer: Medicare Other

## 2017-01-25 DIAGNOSIS — I2692 Saddle embolus of pulmonary artery without acute cor pulmonale: Secondary | ICD-10-CM

## 2017-01-25 LAB — CBC
HEMATOCRIT: 35.3 % — AB (ref 39.0–52.0)
Hemoglobin: 11.5 g/dL — ABNORMAL LOW (ref 13.0–17.0)
MCH: 30.6 pg (ref 26.0–34.0)
MCHC: 32.6 g/dL (ref 30.0–36.0)
MCV: 93.9 fL (ref 78.0–100.0)
Platelets: 191 10*3/uL (ref 150–400)
RBC: 3.76 MIL/uL — AB (ref 4.22–5.81)
RDW: 14.8 % (ref 11.5–15.5)
WBC: 4.3 10*3/uL (ref 4.0–10.5)

## 2017-01-25 LAB — ECHOCARDIOGRAM COMPLETE
Height: 70 in
Weight: 3520.3053 oz

## 2017-01-25 LAB — HEPARIN LEVEL (UNFRACTIONATED): Heparin Unfractionated: 0.91 IU/mL — ABNORMAL HIGH (ref 0.30–0.70)

## 2017-01-25 MED ORDER — RIVAROXABAN (XARELTO) VTE STARTER PACK (15 & 20 MG): ORAL_TABLET | ORAL | 0 refills | Status: DC

## 2017-01-25 MED ORDER — RIVAROXABAN 15 MG PO TABS
15.0000 mg | ORAL_TABLET | Freq: Two times a day (BID) | ORAL | Status: DC
  Administered 2017-01-25: 15 mg via ORAL
  Filled 2017-01-25: qty 1

## 2017-01-25 NOTE — Progress Notes (Signed)
ANTICOAGULATION CONSULT NOTE   Pharmacy Consult for heparin >> Xarelto Indication: pulmonary embolus  Allergies  Allergen Reactions  . Mirapex [Pramipexole Dihydrochloride] Swelling    Patient Measurements: Height: 5\' 10"  (177.8 cm) Weight: 220 lb 0.3 oz (99.8 kg) IBW/kg (Calculated) : 73  Vital Signs: Temp: 98.5 F (36.9 C) (01/07 1036) Temp Source: Oral (01/07 1036) BP: 131/72 (01/07 1036) Pulse Rate: 88 (01/07 1036)  Labs: Recent Labs    01/23/17 1916  01/24/17 0300 01/24/17 0704 01/24/17 1338 01/25/17 0557  HGB 12.2*  --  11.8*  --   --  11.5*  HCT 36.1*  --  35.1*  --   --  35.3*  PLT 195  --  186  --   --  191  LABPROT 13.4  --   --   --   --   --   INR 1.03  --   --   --   --   --   HEPARINUNFRC  --   --   --  0.38  --  0.91*  CREATININE 1.12  --  1.02  --   --   --   TROPONINI  --    < > <0.03 <0.03 <0.03  --    < > = values in this interval not displayed.    Estimated Creatinine Clearance: 67.2 mL/min (by C-G formula based on SCr of 1.02 mg/dL).  Assessment: 84 yo male on heparin infusion for acute PE. No anticoagulation PTA.    Heparin level elevated this morning at 0.91 units/mL. No bleeding or oozing noted by RN.  Hgb and plts are stable.  Now to transition to Xarelto.   Goal of Therapy:  Heparin level 0.3-0.7 units/ml Monitor platelets by anticoagulation protocol: Yes   Plan:  Continue heparin infusion at 1500 units/hr until 1659 this afternoon, then at 1700 give first dose of Xarelto Xarelto 15mg  PO BIDwc x21 full days, then 20mg  qsupper starting on 02/16/17 CBC in the morning   Rosemaria Inabinet D. Nicolas Sisler, PharmD, BCPS Clinical Pharmacist Clinical Phone for 01/25/2017 until 3:30pm: [phone] If after 3:30pm, please call main pharmacy at [phone] 01/25/2017 3:12 PM

## 2017-01-25 NOTE — Care Management Note (Signed)
Case Management Note  Patient Details  Name: Jacob Tran MRN: [id number] Date of Birth: 12-03-1933  Subjective/Objective:     PE               Action/Plan: Discharge Planning: NCM spoke to pt and wife at bedside. Pt has 24 hour caregivers at home. Has RW, hospital bed and wheelchair. Provided wife with a Xarelto 30 day free trial card. [facility]ed [facility] [address] and they do have in stock. Wife will call PCP for a follow up appointment in one week. Wife declines HH at this time.   PCP Sharlene DoryWENDLING, NICHOLAS PAUL    Expected Discharge Date:  01/25/17               Expected Discharge Plan:  Home w Home Health Services  In-House Referral:  NA  Discharge planning Services  CM Consult  Post Acute Care Choice:  Home Health Choice offered to:  Spouse  DME Arranged:  N/A DME Agency:  NA  HH Arranged:  Patient Refused HH HH Agency:  NA  Status of Service:  Completed, signed off  If discussed at Long Length of Stay Meetings, dates discussed:    Additional Comments:  Elliot CousinShavis, Odai Wimmer Ellen, RN 01/25/2017, 4:40 PM

## 2017-01-25 NOTE — Progress Notes (Signed)
  Echocardiogram 2D Echocardiogram has been performed.  Mahad Newstrom G Tanyon Alipio 01/25/2017, 1:11 PM

## 2017-01-25 NOTE — Progress Notes (Signed)
Benefits check for Xarelto and Eliquis. Pt will be provided with a 30 day free trial card for either medication.   # 2. CONSUELA @  [facility] # [phone] [phone]   1. XARELTO 20 MG DAILY  COVER- YES  CO-PAY- $ 45.00  TIER- 3 DRUG  PRIOR APPROVAL- NO   2. ELIQUIS 5 MG BID  COVER- YES  CO-PAY- $ 45.00  TIER- 3 DRUG  PRIOR APPROVAL- YES # [id number]   PREFERRED PHARMACY : [facility] AND [facility]   Isidoro DonningAlesia Khushi Zupko RN CCM Case Mgmt phone [phone]

## 2017-01-25 NOTE — Progress Notes (Signed)
Jacob Tran to be D/C'd Home per MD order.  Discussed prescriptions and follow up appointments with the patient. Prescriptions given to patient, medication list explained in detail. Pt verbalized understanding.  Allergies as of 01/25/2017      Reactions   Mirapex [pramipexole Dihydrochloride] Swelling      Medication List    STOP taking these medications   ALEVE 220 MG tablet Generic drug:  naproxen sodium     TAKE these medications   B-12 PO Take 1 mL by mouth daily.   carbidopa-levodopa 25-100 MG tablet Commonly known as:  SINEMET IR TAKE 2 TABLETS BY MOUTH 3  TIMES DAILY   lansoprazole 15 MG capsule Commonly known as:  PREVACID Take 15 mg by mouth daily at 12 noon.   QUEtiapine 50 MG tablet Commonly known as:  SEROQUEL 1/2 at 4 pm, 1 at bedtime, 1/2 PRN What changed:    how much to take  how to take this  when to take this  additional instructions   Rivaroxaban 15 & 20 MG Tbpk Take as directed on package: Start with one 15mg  tablet by mouth twice a day with food. On Day 22, switch to one 20mg  tablet once a day with food.   tamsulosin 0.4 MG Caps capsule Commonly known as:  FLOMAX       Vitals:   01/25/17 1036 01/25/17 1700  BP: 131/72 (!) 162/86  Pulse: 88 79  Resp: 18 17  Temp: 98.5 F (36.9 C) 98.5 F (36.9 C)  SpO2:  95%    Skin clean, dry and intact without evidence of skin break down, no evidence of skin tears noted. IV catheter discontinued intact. Site without signs and symptoms of complications. Dressing and pressure applied. Pt denies pain at this time. No complaints noted.  An After Visit Summary was printed and given to the patient. Patient escorted via WC, and D/C home via private auto.  Britt BologneseAnisha Mabe RN, BSN

## 2017-01-25 NOTE — Discharge Summary (Signed)
Physician Discharge Summary  Reford Olliff ZOX:[id number] DOB: 1933/03/10 DOA: 01/23/2017  PCP: Sharlene Dory, DO  Admit date: 01/23/2017 Discharge date: 01/25/2017  Admitted From: Home  Disposition:  Home   Recommendations for Outpatient Follow-up:  1. Follow up with PCP in 1-2 weeks 2. Please follow up on the following pending results: Lupus anticoagulant  Home Health: Yes  Equipment/Devices: None  Discharge Condition: Fair  CODE STATUS: FULL Diet recommendation: Regular  Brief/Interim Summary: Mr. Birdsall is a 84 yo M with Parkinsons, advanced dementia, presented with leg swelling, found to have right DVT and large PE without RV strain by CT criteria, also hemodynamically stable.       Pulmonary embolism Was started on heparin gtt.  He remained hemodynamically stable and asymptomatic.  Echocardiogram showed normal left and right ventricular function.  He was transitioned to Xarelto after discussion of risks and benefits of anticoagulation.  A lupus anticoagulant was sent at the time of admission due to the medical history of his daughter. If negative, this likely represents a first unprovoked PE, for which 6 months of anticoagulation would be recommended, followed by consideration of stopping vs lifelong anticoagulation per patient and caregiver preference.       Discharge Diagnoses:  Principal Problem:   Pulmonary embolism (HCC) Active Problems:   BPH (benign prostatic hyperplasia)   Parkinson's disease [facility]    Discharge Instructions  Discharge Instructions    Diet - low sodium heart healthy   Complete by:  As directed    Discharge instructions   Complete by:  As directed    From Dr. Maryfrances Bunnell: Mr. Mckey was admitted for DVT (deep venous thrombosis) and PE (pulmonary embolism), blood clots in the leg and lung.   These are treated with anticoagulation (blood thinner) for 6 months.    Take rivaroxaban (Xarelto) 15 mg twice daily for 3 weeks, then 20 mg at  night daily for 6 months  At the end of six months, you should stop this blood thinner, unless recommended by your primary care doctor.   Increase activity slowly   Complete by:  As directed      Allergies as of 01/25/2017      Reactions   Mirapex [pramipexole Dihydrochloride] Swelling      Medication List    STOP taking these medications   ALEVE 220 MG tablet Generic drug:  naproxen sodium     TAKE these medications   B-12 PO Take 1 mL by mouth daily.   carbidopa-levodopa 25-100 MG tablet Commonly known as:  SINEMET IR TAKE 2 TABLETS BY MOUTH 3  TIMES DAILY   lansoprazole 15 MG capsule Commonly known as:  PREVACID Take 15 mg by mouth daily at 12 noon.   QUEtiapine 50 MG tablet Commonly known as:  SEROQUEL 1/2 at 4 pm, 1 at bedtime, 1/2 PRN What changed:    how much to take  how to take this  when to take this  additional instructions   Rivaroxaban 15 & 20 MG Tbpk Take as directed on package: Start with one 15mg  tablet by mouth twice a day with food. On Day 22, switch to one 20mg  tablet once a day with food.   tamsulosin 0.4 MG Caps capsule Commonly known as:  FLOMAX      Follow-up Information    Sharlene Dory, DO Follow up.   Specialty:  Family Medicine Why:  please call to arrange a follow up in one week Contact information: [address]  [place] [address] [phone]          Allergies  Allergen Reactions  . Mirapex [Pramipexole Dihydrochloride] Swelling    Consultations:  None   Procedures/Studies: Ct Angio Chest Pe W/cm &/or Wo Cm  Result Date: 01/23/2017 CLINICAL DATA:  Chest pain increasing shortness of breath. Parkinson's and dementia. Difficulty following commands. EXAM: CT ANGIOGRAPHY CHEST WITH CONTRAST TECHNIQUE: Multidetector CT imaging of the chest was performed using the standard protocol during bolus administration of intravenous contrast. Multiplanar CT image reconstructions and MIPs were  obtained to evaluate the vascular anatomy. CONTRAST:  100mL ISOVUE-370 IOPAMIDOL (ISOVUE-370) INJECTION 76% COMPARISON:  None. FINDINGS: Cardiovascular: Large filling defect in the distal right main pulmonary artery extending into several upper and lower lobe branches consistent with acute pulmonary embolus. RV to LV ratio is 0.81. This suggests right heart strain to be unlikely. Normal heart size. Small pericardial effusion. Normal caliber thoracic aorta. Calcification of the aorta and coronary artery's. Great vessel origins are patent. Mediastinum/Nodes: No enlarged mediastinal, hilar, or axillary lymph nodes. Thyroid gland, trachea, and esophagus demonstrate no significant findings. Lungs/Pleura: Evaluation is limited due to motion artifact throughout. There is evidence of atelectasis in the lung bases. No definite consolidation or edema. No pleural effusions. No pneumothorax. Upper Abdomen: No acute process demonstrated in the upper abdominal contents visualized. Fatty infiltration of the pancreas. Vascular calcifications. Musculoskeletal: Degenerative changes throughout the spine. No destructive bone lesions. Review of the MIP images confirms the above findings. IMPRESSION: 1. Positive examination for large right main and multiple segmental right pulmonary artery emboli. No evidence of right heart strain. 2. Small pericardial effusion. 3. Atelectasis in the lung bases. These results were called by telephone at the time of interpretation on 01/23/2017 at 10:06 pm to Dr. Tilden FossaELIZABETH REES , who verbally acknowledged these results. Aortic Atherosclerosis (ICD10-I70.0). Electronically Signed   By: Burman NievesWilliam  Stevens M.D.   On: 01/23/2017 22:08   [place] Venous Img Lower Unilateral Right  Result Date: 01/23/2017 CLINICAL DATA:  Right leg swelling for 4 days. Edema. Parkinson's disease. Patient is sedentary. EXAM: RIGHT LOWER EXTREMITY VENOUS DOPPLER ULTRASOUND TECHNIQUE: Gray-scale sonography with graded compression, as well  as color Doppler and duplex ultrasound were performed to evaluate the lower extremity deep venous systems from the level of the common femoral vein and including the common femoral, femoral, profunda femoral, popliteal and calf veins including the posterior tibial, peroneal and gastrocnemius veins when visible. The superficial great saphenous vein was also interrogated. Spectral Doppler was utilized to evaluate flow at rest and with distal augmentation maneuvers in the common femoral, femoral and popliteal veins. COMPARISON:  None. FINDINGS: Contralateral Common Femoral Vein: Respiratory phasicity is normal and symmetric with the symptomatic side. No evidence of thrombus. Normal compressibility. Common Femoral Vein: No evidence of thrombus. Normal compressibility, respiratory phasicity and response to augmentation. Saphenofemoral Junction: No evidence of thrombus. Normal compressibility and flow on color Doppler imaging. Profunda Femoral Vein: No evidence of thrombus. Normal compressibility and flow on color Doppler imaging. Femoral Vein: There is nonocclusive thrombus beginning in the mid right femoral vein. In the distal femoral vein, there is occlusive thrombus. Popliteal Vein: Occlusive thrombus.  The vein is noncompressible. Calf Veins: Occlusive thrombus.  Veins are noncompressible. Superficial Great Saphenous Vein: No evidence of thrombus. Normal compressibility. Venous Reflux:  Not visualized Other Findings: In the right medial popliteal fossa, an anechoic and hypoechoic collection is 9.8 x 4.6 x 6.7 cm. IMPRESSION: 1. Occlusive thrombus in the distal right femoral vein,  extending into the popliteal vein and calf veins. 2. Right popliteal fossa fluid collection. Electronically Signed   By: Norva Pavlov M.D.   On: 01/23/2017 19:42       Subjective: Patient minimally verbal.  No nursing nor caregiver complaints.  Discharge Exam: Vitals:   01/25/17 1036 01/25/17 1700  BP: 131/72 (!) 162/86   Pulse: 88 79  Resp: 18 17  Temp: 98.5 F (36.9 C) 98.5 F (36.9 C)  SpO2:  95%   Vitals:   01/25/17 0458 01/25/17 0900 01/25/17 1036 01/25/17 1700  BP: 135/78 131/72 131/72 (!) 162/86  Pulse: 71 88 88 79  Resp: 18 18 18 17   Temp: 98.2 F (36.8 C) 98.5 F (36.9 C) 98.5 F (36.9 C) 98.5 F (36.9 C)  TempSrc: Oral Oral Oral Oral  SpO2: 96% 98%  95%  Weight:   99.8 kg (220 lb 0.3 oz)   Height:   5\' 10"  (1.778 m)     General: Pt is alert, awake, not in acute distress, eating a banana, pleasantly placidly demented Cardiovascular: RRR, S1/S2 +, no rubs, no gallops Respiratory: CTA bilaterally, no wheezing, no rhonchi Abdominal: Soft, NT, ND, bowel sounds + Extremities: no edema, no cyanosis    The results of significant diagnostics from this hospitalization (including imaging, microbiology, ancillary and laboratory) are listed below for reference.     Microbiology: No results found for this or any previous visit (from the past 240 hour(s)).   Labs: BNP (last 3 results) No results for input(s): BNP in the last 8760 hours. Basic Metabolic Panel: Recent Labs  Lab 01/23/17 1916 01/24/17 0300  NA 136 136  K 3.8 3.6  CL 105 107  CO2 24 23  GLUCOSE 96 110*  BUN 35* 28*  CREATININE 1.12 1.02  CALCIUM 8.7* 8.6*   Liver Function Tests: Recent Labs  Lab 01/24/17 0300  AST 17  ALT 16*  ALKPHOS 89  BILITOT 0.6  PROT 6.1*  ALBUMIN 3.2*   No results for input(s): LIPASE, AMYLASE in the last 168 hours. No results for input(s): AMMONIA in the last 168 hours. CBC: Recent Labs  Lab 01/23/17 1916 01/24/17 0300 01/25/17 0557  WBC 4.1 4.6 4.3  NEUTROABS 2.7  --   --   HGB 12.2* 11.8* 11.5*  HCT 36.1* 35.1* 35.3*  MCV 95.0 94.1 93.9  PLT 195 186 191   Cardiac Enzymes: Recent Labs  Lab 01/23/17 2104 01/24/17 0300 01/24/17 0704 01/24/17 1338  TROPONINI <0.03 <0.03 <0.03 <0.03   BNP: Invalid input(s): POCBNP CBG: No results for input(s): GLUCAP in the last  168 hours. D-Dimer No results for input(s): DDIMER in the last 72 hours. Hgb A1c No results for input(s): HGBA1C in the last 72 hours. Lipid Profile No results for input(s): CHOL, HDL, LDLCALC, TRIG, CHOLHDL, LDLDIRECT in the last 72 hours. Thyroid function studies No results for input(s): TSH, T4TOTAL, T3FREE, THYROIDAB in the last 72 hours.  Invalid input(s): FREET3 Anemia work up No results for input(s): VITAMINB12, FOLATE, FERRITIN, TIBC, IRON, RETICCTPCT in the last 72 hours. Urinalysis    Component Value Date/Time   BILIRUBINUR neg 11/05/2015 1729   PROTEINUR positive 1+ 11/05/2015 1729   UROBILINOGEN 0.2 11/05/2015 1729   NITRITE neg 11/05/2015 1729   LEUKOCYTESUR Negative 11/05/2015 1729   Sepsis Labs Invalid input(s): PROCALCITONIN,  WBC,  LACTICIDVEN Microbiology No results found for this or any previous visit (from the past 240 hour(s)).   Time coordinating discharge: Over 30 minutes  SIGNED:  Alberteen Sam, MD  [facility] 01/25/2017, 7:38 PM

## 2017-01-25 NOTE — Progress Notes (Signed)
ANTICOAGULATION CONSULT NOTE   Pharmacy Consult for heparin  Indication: pulmonary embolus  Allergies  Allergen Reactions  . Mirapex [Pramipexole Dihydrochloride] Swelling    Patient Measurements: Weight: 220 lb 0.3 oz (99.8 kg)  Vital Signs: Temp: 98.5 F (36.9 C) (01/07 0900) Temp Source: Oral (01/07 0900) BP: 131/72 (01/07 0900) Pulse Rate: 88 (01/07 0900)  Labs: Recent Labs    01/23/17 1916  01/24/17 0300 01/24/17 0704 01/24/17 1338 01/25/17 0557  HGB 12.2*  --  11.8*  --   --  11.5*  HCT 36.1*  --  35.1*  --   --  35.3*  PLT 195  --  186  --   --  191  LABPROT 13.4  --   --   --   --   --   INR 1.03  --   --   --   --   --   HEPARINUNFRC  --   --   --  0.38  --  0.91*  CREATININE 1.12  --  1.02  --   --   --   TROPONINI  --    < > <0.03 <0.03 <0.03  --    < > = values in this interval not displayed.    Estimated Creatinine Clearance: 67.2 mL/min (by C-G formula based on SCr of 1.02 mg/dL).  Assessment: 84 yo male on heparin infusion for acute PE. No anticoagulation PTA.    Heparin level elevated this morning at 0.91 units/mL. No bleeding or oozing noted by RN.  Hgb and plts are stable.  Goal of Therapy:  Heparin level 0.3-0.7 units/ml Monitor platelets by anticoagulation protocol: Yes   Plan:  Decrease heparin infusion to 1500 units/hr  Heparin level in 8 hours Heparin level and CBC daily  Follow for transition to PO anticoagulation  Halayna Blane D. Ashford Clouse, PharmD, BCPS Clinical Pharmacist Clinical Phone for 01/25/2017 until 3:30pm: [phone] If after 3:30pm, please call main pharmacy at [phone] 01/25/2017 10:05 AM

## 2017-01-26 ENCOUNTER — Telehealth: Payer: Self-pay

## 2017-01-26 LAB — LUPUS ANTICOAGULANT PANEL
DRVVT: 35.6 s (ref 0.0–47.0)
PTT LA: 41.6 s (ref 0.0–51.9)

## 2017-01-26 NOTE — Consult Note (Signed)
            [facility] Primary Care Navigator  01/26/2017  Anne FuJohn Lafoy 10/16/1933 [id number]   Attempt to seepatient at the bedsideto identify possible discharge needs but he was already discharged per staff report.   Patient was dischargedhomeyesterday. Per chart review, hewasadmitted and treated for DVT (deep venous thrombosis) and PE (pulmonary embolism), blood clots in the leg and lung.  Primary care provider's office is listed as providing transition of care (TOC). Noted that transition of care follow-up call was done by primary care provider's office today by Marylouise StacksKaren Carter, LPN.   For additional questions please contact:  Karin GoldenLorraine A. Luc Shammas, BSN, RN-BC [facility] PRIMARY CARE Navigator Cell: [phone]

## 2017-01-26 NOTE — Telephone Encounter (Signed)
01/26/17  TCM Hospital Follow Up  Transition Care Management Follow-up Telephone Call  ADMISSION DATE: 01/23/17  DISCHARGE DATE: 01/25/17   How have you been since you were released from the hospital?  Per with patient bit his lip and it is swollen and bruised. Otherwise he is doing ok.   Do you understand why you were in the hospital? Yes per wife.    Do you understand the discharge instrcutions? Yes per wife    Items Reviewed:  Medications reviewed: Yes. Patient stopped Flomax per patients wife.   Allergies reviewed: Yes   Dietary changes reviewed: Low Sodium Heart Healthy   Referrals reviewed: Appointment scheduled with Dr. Carmelia RollerWendling for Hospital follow up visit.   Functional Questionnaire:   Activities of Daily Living (ADLs): Per wife patient needs assistance with all except feeding.    Confirmed importance and date/time of follow-up visits scheduled: Yes   Confirmed with patient if condition begins to worsen call PCP or go to the ER. Yes    Patient was given the office number and encouragred to call back with questions or concerns.

## 2017-02-03 ENCOUNTER — Telehealth: Payer: Self-pay | Admitting: Neurology

## 2017-02-03 NOTE — Telephone Encounter (Signed)
Received paperwork from hospice. Per Dr. Arbutus Leasat I have sent this to patient's PCP with a note asking that they be the attending provider for hospice. They are to let us know if they are unable to sign.

## 2017-02-08 ENCOUNTER — Telehealth: Payer: Self-pay | Admitting: *Deleted

## 2017-02-08 NOTE — Telephone Encounter (Signed)
Received Physician Orders from [facility]. Paperwork was sent via Dr. Don Perkingat's office, [facility], via fax [phone], requesting that Dr. Carmelia RollerWendling as PCP to be Attending with Hospice/Palliative Care; forwarded to provider/SLS 01/21

## 2017-02-10 ENCOUNTER — Encounter: Payer: Self-pay | Admitting: Family Medicine

## 2017-02-10 ENCOUNTER — Inpatient Hospital Stay: Payer: Medicare Other | Admitting: Family Medicine

## 2017-02-10 ENCOUNTER — Ambulatory Visit (INDEPENDENT_AMBULATORY_CARE_PROVIDER_SITE_OTHER): Payer: Medicare Other | Admitting: Family Medicine

## 2017-02-10 VITALS — BP 142/78 | HR 87 | Temp 97.4°F | Ht 70.0 in | Wt 217.0 lb

## 2017-02-10 DIAGNOSIS — I2699 Other pulmonary embolism without acute cor pulmonale: Secondary | ICD-10-CM

## 2017-02-10 DIAGNOSIS — I82491 Acute embolism and thrombosis of other specified deep vein of right lower extremity: Secondary | ICD-10-CM | POA: Diagnosis not present

## 2017-02-10 DIAGNOSIS — I82409 Acute embolism and thrombosis of unspecified deep veins of unspecified lower extremity: Secondary | ICD-10-CM | POA: Insufficient documentation

## 2017-02-10 LAB — CBC
HEMATOCRIT: 37.6 % — AB (ref 39.0–52.0)
HEMOGLOBIN: 12.7 g/dL — AB (ref 13.0–17.0)
MCHC: 33.7 g/dL (ref 30.0–36.0)
MCV: 95 fl (ref 78.0–100.0)
PLATELETS: 217 10*3/uL (ref 150.0–400.0)
RBC: 3.95 Mil/uL — ABNORMAL LOW (ref 4.22–5.81)
RDW: 15.6 % — AB (ref 11.5–15.5)
WBC: 5 10*3/uL (ref 4.0–10.5)

## 2017-02-10 MED ORDER — RIVAROXABAN 20 MG PO TABS: 20.0000 mg | ORAL_TABLET | Freq: Every day | ORAL | 1 refills | Status: DC

## 2017-02-10 NOTE — Progress Notes (Signed)
Chief Complaint  Patient presents with  . Hospitalization Follow-up    HPI Jacob Tran is a 84 y.o. y.o. male who presents for a transition of care visit.  Pt was discharged from [facility] on 01/25/17.  Within 48 business hours of discharge our office contacted him via telephone to coordinate his care and needs. Here w wife who provides hx.  Hosp stay from 01/23/17-01/25/17 for DVT and PE. He was HD stable, no R heart strain. Started on Xarelto for 6 total mo of anticoagulation. Currently on 15 mg bid, switching to 20 mg qd next week. DC summary said to f/u on neg lupus anticoagulant, spouse not interested and he is currently on Xarelto so checking now would not be beneficial. Breathing appears to be improving. No reports of pain today.  BP has been high lately w readings of 160-170s over 90-100's. Used to be on medication.   Social History   Socioeconomic History  . Marital status: Married  Tobacco Use  . Smoking status: Never Smoker  . Smokeless tobacco: Never Used  Substance and Sexual Activity  . Alcohol use: Yes    Alcohol/week: 0.0 oz    Comment: 2 glasses of wine weekly, varies  . Drug use: No   Past Medical History:  Diagnosis Date  . 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 Enlarged prostate   . Hyperlipidemia   . Hypertension   . Parkinson's disease (HCC)    Family History  Problem Relation Age of Onset  . Lupus Daughter   . Healthy Daughter        x2   Allergies as of 02/10/2017      Reactions   Mirapex [pramipexole Dihydrochloride] Swelling      Medication List        Accurate as of 02/10/17 12:34 PM. Always use your most recent med list.          B-12 PO Take 1 mL by mouth daily.   carbidopa-levodopa 25-100 MG tablet Commonly known as:  SINEMET IR TAKE 2 TABLETS BY MOUTH 3  TIMES DAILY   QUEtiapine 50 MG tablet Commonly known as:  SEROQUEL 1/2 at 4 pm, 1 at bedtime, 1/2 PRN   Rivaroxaban 15 & 20 MG Tbpk Take as  directed on package: Start with one 15mg  tablet by mouth twice a day with food. On Day 22, switch to one 20mg  tablet once a day with food.   rivaroxaban 20 MG Tabs tablet Commonly known as:  XARELTO Take 1 tablet (20 mg total) by mouth daily with supper. Start taking on:  02/22/2017       ROS:  Pt nonverbal, reliable ros unable to be attained.   Objective BP (!) 142/78   Pulse 87   Temp (!) 97.4 F (36.3 C) (Oral)   Ht 5\' 10"  (1.778 m)   Wt 217 lb (98.4 kg)   SpO2 97%   BMI 31.14 kg/m  General Appearance:  awake, alert, oriented, in no acute distress and well developed, well nourished Skin:  there are no suspicious lesions or rashes of concern Head/face:  NCAT Eyes:  EOMI, PERRLA Ears:  canals and TMs NI Nose/Sinuses:  negative Mouth/Throat:  Mucosa moist, no lesions; pharynx without erythema, edema or exudate. Neck:  neck- supple, no mass, non-tender and no jvd Lungs: Clear to auscultation.  No rales, rhonchi, or wheezing. Normal effort, no accessory muscle use. Heart:  Heart sounds are normal.  Regular rate and rhythm without murmur,  gallop or rub. No bruits. Abdomen:  BS+, soft, NT, ND, no masses or organomegaly Musculoskeletal:  No muscle group atrophy or asymmetry, gait normal Neurologic:  Alert and oriented x 3, gait normal., reflexes normal and symmetric, strength and  sensation grossly normal Psych exam: Nml mood and affect, age appropriate judgment and insight  Other acute pulmonary embolism without acute cor pulmonale (HCC) - Plan: CBC, Basic metabolic panel  Acute deep vein thrombosis (DVT) of other specified vein of right lower extremity (HCC) - Plan: CBC, Basic metabolic panel  Discharge summary and medication list have been reviewed/reconciled.  Labs pending at the time of discharge have been reviewed or are still pending at the time of this visit.  Follow-up labs and appointments have been ordered and/or coordinated appropriately. Educational materials  regarding the patient's admitting diagnosis provided.  TRANSITIONAL CARE MANAGEMENT CERTIFICATION:  I certify the following are true:   1. Communication with the patient/care giver was made within 2 business days of discharge.  2. Complexity of Medical decision making is moderate.  3. Face to face visit occurred within 14 days of discharge.   Monitor home BP's, if still elevated, RTC. [name] voiced understanding and agreement to the plan.  Jilda Rocheicholas Paul Talking RockWendling, DO 02/10/17 12:34 PM

## 2017-02-10 NOTE — Patient Instructions (Addendum)
Give us 2-3 business days to get the results of your labs back. If labs are normal, you will likely receive a letter in the mail unless you have MyChart. This can take longer than 2-3 business days.    Last date of Xarelto should be 07/24/17. We will send in refills to [facility].   Let us know if you need anything.

## 2017-02-10 NOTE — Progress Notes (Signed)
Pre visit review using our clinic review tool, if applicable. No additional management support is needed unless otherwise documented below in the visit note. 

## 2017-02-11 LAB — BASIC METABOLIC PANEL
BUN: 35 mg/dL — AB (ref 6–23)
CALCIUM: 9.1 mg/dL (ref 8.4–10.5)
CHLORIDE: 104 meq/L (ref 96–112)
CO2: 26 mEq/L (ref 19–32)
CREATININE: 1.16 mg/dL (ref 0.40–1.50)
GFR: 64.16 mL/min (ref >60.00)
Glucose, Bld: 119 mg/dL — ABNORMAL HIGH (ref 70–99)
Potassium: 4.1 mEq/L (ref 3.5–5.1)
Sodium: 139 mEq/L (ref 135–145)

## 2017-02-18 ENCOUNTER — Encounter: Payer: Self-pay | Admitting: Family Medicine

## 2017-02-22 ENCOUNTER — Other Ambulatory Visit: Payer: Self-pay | Admitting: Family Medicine

## 2017-02-22 MED ORDER — AMLODIPINE BESYLATE 5 MG PO TABS: 5.0000 mg | ORAL_TABLET | Freq: Every day | ORAL | 3 refills | Status: DC

## 2017-03-08 ENCOUNTER — Ambulatory Visit: Payer: Medicare Other | Admitting: Neurology

## 2017-03-08 ENCOUNTER — Telehealth: Payer: Self-pay | Admitting: Neurology

## 2017-03-08 NOTE — Telephone Encounter (Signed)
Spoke with patient's wife. She was calling to apologize for missing patients appt today. I let her know that it looks like appt today was r/s back in December. Patient's next appt is 04/27/17. She will call with any other questions.

## 2017-03-08 NOTE — Telephone Encounter (Signed)
Pt's spouse called and left a VM message asking for a call back did not say the reason why

## 2017-04-22 ENCOUNTER — Telehealth: Payer: Self-pay | Admitting: *Deleted

## 2017-04-22 NOTE — Telephone Encounter (Signed)
Received Physician Orders from [facility]; forwarded to provider/SLS 04/04

## 2017-04-26 NOTE — Progress Notes (Signed)
Jacob Tran was seen today in the movement disorders clinic for neurologic consultation at the request of Sharlene Dory, DO.  The consultation is for the evaluation of PD.  This patient is accompanied in the office by his spouse who supplements the history.  Prior neurology records are reviewed but they only go back to 2012, which was a follow up visit.  Wife provides most of history today.  Pt was diagnosed with PD in approximately 2010.  His wife states that he was having trouble standing up and having a problem with his back; he was having injections and those were not helping and referral was made to a neurologist and he was dx with PD.  He was first tried on Mirapex, which caused hallucinations and edema.  Wife thinks that memory problems may have preceeded the diagnosis but is not sure.  Records that I have do not support that.  12/28/14 update:  The patient follows up today, accompanied by his wife who supplements the history.  The patient is on carbidopa/levodopa 25/100, 2 tablets 3 times per day.  I did ask him to hold his medication prior to his visit today but he didn't do that.  He remains on Aricept, 10 mg daily.  I referred him for physical, occupational, and speech therapy since last visit.  It appears that the rehabilitation center called him to schedule it, but the patient never returned their calls. Pt told his wife that he didn't want to go.  No falls.  No hallucinations.  No lightheadeness or near syncope.    I did have the opportunity to review records since our last visit.  The patient was first diagnosed in August, 2010 after noting left greater than right tremor and shuffling.  He was started on Mirapex 1.5 mg.  That did not seem to help and he was subsequently increased to 2.25 mg.  That caused visual hallucinations and it was discontinued.  He was 84 years old at the time.  In April, 2011 levodopa was started.  04/30/15 update:  The patient follows up today, accompanied  by his wife who supplements the history.  The patient has a history of Parkinson's disease with Parkinson's disease dementia.  He is on carbidopa/levodopa 25/100, 2 tablets 3 times per day.  I asked him to hold his medication prior to today's visit, as I have never seen him "off" medication. He states that he is "not so good."  When I asked him what wasn't so good, he states "my breathing."  He did go to physical, occupational and speech therapy since our last visit.  He did well with this.  He doesn't appear to remember this at all.  He has not had falls.  Wife states that he cannot get out of the chair without assist.   He remains on Aricept, 10 mg daily for dementia.  No hallucinations.  10/31/15 update:  Patient follows up today, accompanied by his wife who supplements the history.  I have not seen him in about 6 months.  He has a history of Parkinson's disease and associated Parkinson's disease dementia.  He is on carbidopa/levodopa 25/100, 2 tablets 3 times per day.  His memory is "horrible" per wife but he is off of aricept.  Has 24 hour/day care now as wife works.  Got a lift chair yesterday.  He has not had any hallucinations.  No lightheadedness or near syncope.  Occasionally will get up in the middle of the night to  use the restroom and will "slide" out of the bed.  Otherwise no other falls.    04/30/16 update:  Pt seen today in f/u with his wife and a caregiver who supplements the hx.  Pt on carbidopa/levodopa 25/100, 2 po tid.  Wife states "he has no memory."   Wife states patient has had no visual hallucinations but rarely he will say something about talking to his daughter who lives in [place].  However, caregiver has noted visual hallucinations.  Playing checking checkers with the air.  Has "sundowning."  Taking clothes out of drawer and throws them in the trash.  Not sleeping at night because has to go to bathroom.  Wife tried to put in cath but he took it out.  He has no falls but sometimes  slides to the floor.  He has a caregiver 5 days a week and then his wife takes care of other days.  No lightheadedness or near syncope.  He is sleeping all day and awake all night.  Cannot keep him in the bed at night.  He will pull the diaper off if they try to keep him in one.  He has trouble turning over at night.  He is in the bed about 15-16 hours per day.    09/03/16 update:  Patient seen today in follow-up for his parkinsonism, accompanied by his 2 caregivers who supplement the history.  Patient is on carbidopa/levodopa 25/100, 2 tablets 3 times per day.  We started quetiapine last visit and worked up to 50 mg at night.  We ended up stopping the titration, primarily because he was having some bladder issues that he was up all night.  He did not care for the urologist that he was seeing.  He ended up following up with his primary care physician for his urology issues.  I have reviewed those notes.  Ultimately, they decided to use nighttime Foley catheter.  That has been helpful and he is sleeping at night with that.  Hallucinations are better and the caregivers think that is because of better sleep.  Wife thought that his seroquel was for bladder and didn't want to continue but when found out for hallucinations they realized it could be helping.    12/25/16 update:  Pt seen in f/u.  Pt presents with caregiver and wife who supplement the history.  Agitation is worse, especially as the sun goes worse.  On seroquel 50 q hs.  Caregiver/wife states that "I cannot tell it helps at all."  Wife frustrated.  Some trouble with taking food in the day and just holding it in the mouth and not swallowing it.    04/27/17 update: Patient is seen today in follow-up.  Patient is with his caregiver and wife who supplies most of the history.  Pt does state that he is "not good." I have talked with a hospice physician about him as well.  Records were also reviewed.  He did not qualify for hospice.  He did qualify for palliative  care.  He is on Seroquel, 25 mg at 4 PM prn (told them to do this scheduled previously) and 50 mg at bedtime. They give the 1/2 tablet a few times per week if agitated and it helps.  It does often put him to sleep.   Wife states that hallucinations seem to come and go.   He was in the hospital for a few days in January due to DVT on the right.  He is now on Xarelto.  No falls.  He walks for exercise - he keeps getting up to go to the bathroom even though he has a catheter in.  He colors, does math books and does puzzles.  Wife reports he does really well with the math books.  Caregivers are in the home most of the time.  He has a good appetite but sometimes he will pocket food in the mouth and not swallow.   He is still on carbidopa/levodopa 2 po tid.   Neuroimaging has not previously been performed.   PREVIOUS MEDICATIONS: Mirapex (first drug that was tried per wife and it caused LE edema and hallucinations; carbidopa/levodopa (thinks that it helps tremor)  ALLERGIES:   Allergies  Allergen Reactions  . Mirapex [Pramipexole Dihydrochloride] Swelling    CURRENT MEDICATIONS:  Outpatient Encounter Medications as of 04/27/2017  Medication Sig  . amLODipine (NORVASC) 5 MG tablet Take 1 tablet (5 mg total) by mouth daily.  . carbidopa-levodopa (SINEMET IR) 25-100 MG tablet TAKE 2 TABLETS BY MOUTH 3  TIMES DAILY  . QUEtiapine (SEROQUEL) 50 MG tablet 1/2 at 4 pm, 1 at bedtime, 1/2 PRN (Patient taking differently: 1 tab (50 mg) at bedtime, 1/2 tab PRN during the day)  . rivaroxaban (XARELTO) 20 MG TABS tablet Take 1 tablet (20 mg total) by mouth daily with supper.  . [DISCONTINUED] Cyanocobalamin (B-12 PO) Take 1 mL by mouth daily.  . [DISCONTINUED] Rivaroxaban 15 & 20 MG TBPK Take as directed on package: Start with one 15mg  tablet by mouth twice a day with food. On Day 22, switch to one 20mg  tablet once a day with food.   No facility-administered encounter medications on file as of 04/27/2017.     PAST  MEDICAL HISTORY:   Past Medical History:  Diagnosis Date  . 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 Enlarged prostate   . Hyperlipidemia   . Hypertension   . Parkinson's disease (HCC)     PAST SURGICAL HISTORY:   Past Surgical History:  Procedure Laterality Date  . TONSILLECTOMY      SOCIAL HISTORY:   Social History   Socioeconomic History  . Marital status: Married    Spouse name: Not on file  . Number of children: Not on file  . Years of education: Not on file  . Highest education level: Not on file  Occupational History  . Not on file  Social Needs  . Financial resource strain: Not on file  . Food insecurity:    Worry: Not on file    Inability: Not on file  . Transportation needs:    Medical: Not on file    Non-medical: Not on file  Tobacco Use  . Smoking status: Never Smoker  . Smokeless tobacco: Never Used  Substance and Sexual Activity  . Alcohol use: Yes    Alcohol/week: 0.0 oz    Comment: 2 glasses of wine weekly, varies  . Drug use: No  . Sexual activity: Not on file  Lifestyle  . Physical activity:    Days per week: Not on file    Minutes per session: Not on file  . Stress: Not on file  Relationships  . Social connections:    Talks on phone: Not on file    Gets together: Not on file    Attends religious service: Not on file    Active member of club or organization: Not on file    Attends meetings of clubs or organizations: Not on file  Relationship status: Not on file  . Intimate partner violence:    Fear of current or ex partner: Not on file    Emotionally abused: Not on file    Physically abused: Not on file    Forced sexual activity: Not on file  Other Topics Concern  . Not on file  Social History Narrative  . Not on file    FAMILY HISTORY:   Family Status  Relation Name Status  . Mother  Deceased  . Father  Deceased  . Daughter  (Not Specified)  . Daughter  (Not Specified)    ROS:  A  complete 10 system review of systems was obtained and was unremarkable apart from what is mentioned above.  PHYSICAL EXAMINATION:    VITALS:   Vitals:   04/27/17 1425  BP: 122/70  Pulse: 78  SpO2: 93%  Weight: 213 lb (96.6 kg)  Height: 5\' 11"  (1.803 m)   Wt Readings from Last 3 Encounters:  04/27/17 213 lb (96.6 kg)  02/10/17 217 lb (98.4 kg)  01/25/17 220 lb 0.3 oz (99.8 kg)     GEN:  The patient appears stated age and is in NAD. HEENT:  Normocephalic, atraumatic.  The mucous membranes are moist. The superficial temporal arteries are without ropiness or tenderness. CV:  RRR Lungs:  CTAB Neck/HEME:  There are no carotid bruits bilaterally.  Neurological examination:  Orientation: does not/cannot answer questions of orientation.  Can tell me his SSN.  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 Montreal Cognitive Assessment  10/31/2015 12/28/2014  Visuospatial/ Executive (0/5) 1 5  Naming (0/3) 2 2  Attention: Read list of digits (0/2) 2 2  Attention: Read list of letters (0/1) 1 1  Attention: Serial 7 subtraction starting at 100 (0/3) 3 1  Language: Repeat phrase (0/2) 2 2  Language : Fluency (0/1) 0 1  Abstraction (0/2) 2 2  Delayed Recall (0/5) 0 0  Orientation (0/6) 0 1  Total 13 17  Adjusted Score (based on education) 13 17   Cranial nerves: There is good facial symmetry. There is significant facial hypomimia.   The visual fields are full to confrontational testing. The speech is limited.  It is dysarthric.  There is very little spontaneity of speech.   Soft palate rises symmetrically and there is no tongue deviation. Hearing is intact to conversational tone. Sensation: Sensation is intact to light touch throughout. Motor: Strength is 5/5 in the bilateral upper and lower extremities.   Shoulder shrug is equal and symmetric.  There is no pronator drift.   Movement examination: Tone:  There is normal tone in the UE Abnormal movements: none Coordination:  There is decremation with RAM's, seen with finger taps, hand opening and closing, bilaterally.  There is decreased RAM's with heel taps and toe taps bilaterally.   Some of this is related to apraxia Gait and Station: he festinates with ambulation.  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  Has a stooped posture.  Unstable.    ASSESSMENT/PLAN:  1.  Parkinson's disease with Parkinson's disease dementia.  -The patient will remain on carbidopa/levodopa 25/100, 2 tablets 3 times per day.  Based on visit today, I wonder if he has vascular parkinsonism, but it is rather academic now and I don't want to change medication now.  -continue seroquel 50 mg at night at add 25 mg at 4 pm prn.  We did  talk about the fact that the atypical antipsychotic medications are not indicated for dementia related psychosis and increased risk of mortality in the elderly, usually because of infectious or  cardiac related. Understanding is expressed and they were agreeable that the benefits outweigh the risks in this case.  -wife asks about disease progression/fact that he sometimes confuses her for his mother/ex wife/sister and reassured her that this is normal and part of the disease.   2.  Urinary incontinence  -Is using Foley catheter  3.  F/u in 6-8 months.  Refills given today.  Much greater than 50% of this visit was spent in counseling and coordinating care.  Total face to face time:  25 min

## 2017-04-27 ENCOUNTER — Ambulatory Visit (INDEPENDENT_AMBULATORY_CARE_PROVIDER_SITE_OTHER): Payer: Medicare Other | Admitting: Neurology

## 2017-04-27 ENCOUNTER — Encounter: Payer: Self-pay | Admitting: Neurology

## 2017-04-27 VITALS — BP 122/70 | HR 78 | Ht 71.0 in | Wt 213.0 lb

## 2017-04-27 DIAGNOSIS — F02818 Dementia in other diseases classified elsewhere, unspecified severity, with other behavioral disturbance: Secondary | ICD-10-CM

## 2017-04-27 DIAGNOSIS — F0281 Dementia in other diseases classified elsewhere with behavioral disturbance: Secondary | ICD-10-CM | POA: Diagnosis not present

## 2017-04-27 DIAGNOSIS — G20 Parkinson's disease: Secondary | ICD-10-CM

## 2017-04-27 MED ORDER — CARBIDOPA-LEVODOPA 25-100 MG PO TABS: ORAL_TABLET | ORAL | 3 refills | Status: DC

## 2017-04-27 MED ORDER — QUETIAPINE FUMARATE 50 MG PO TABS: ORAL_TABLET | ORAL | 1 refills | Status: DC

## 2017-04-28 DIAGNOSIS — R300 Dysuria: Secondary | ICD-10-CM | POA: Diagnosis not present

## 2017-06-12 ENCOUNTER — Other Ambulatory Visit: Payer: Self-pay | Admitting: Family Medicine

## 2017-06-24 ENCOUNTER — Telehealth: Payer: Self-pay | Admitting: Family Medicine

## 2017-06-24 ENCOUNTER — Encounter: Payer: Self-pay | Admitting: Family Medicine

## 2017-06-24 NOTE — Telephone Encounter (Signed)
OK to do. TY.   ----- Message -----  From: Pincus SanesEwing, Robin B, CMA  Sent: 06/24/2017  8:03 AM  To: Sharlene DoryNicholas Paul Wendling, DO  Subject: FW: Non-Urgent Medical Question               ----- Message -----  From: Jacob KelpNancy Kant  Sent: 06/22/2017  9:02 PM  To: [facility] Clinical Pool  Subject: Non-Urgent Medical Question             ----- Message from Mychart, Generic sent at 06/22/2017 9:02 PM EDT -----    Hi Dr. Carmelia RollerWendling,  Will you please fax a note to the social security office that Jacob RuizJohn is not able to take care of his finances and I am his payee. I am trying to change banks and the SS office said they would need a note from you to that effect. Their fax number is [phone].  Thanks for your help.  Jacob Tran    They have him listed as Jacob Tran, Jacob Tran.   Letter completed and faxed as instructed

## 2017-06-30 ENCOUNTER — Telehealth: Payer: Self-pay | Admitting: Family Medicine

## 2017-06-30 NOTE — Telephone Encounter (Signed)
Spoke w pt's wife discussing frank blood in catheter. No pain, fevers or d/c. He is on Xarelto, no known trauma. Will stop Xarelto for 5-7 d and see if it resolves, if not follow up with urology. We did discuss risks and benefits of stopping. Pt's spouse voiced understanding and agreement to the plan.

## 2017-07-07 ENCOUNTER — Telehealth: Payer: Self-pay | Admitting: *Deleted

## 2017-07-07 NOTE — Telephone Encounter (Signed)
Received Physician Orders from [facility]; forwarded to provider/SLS 06/19

## 2017-07-08 ENCOUNTER — Telehealth: Payer: Self-pay | Admitting: Family Medicine

## 2017-07-08 NOTE — Telephone Encounter (Signed)
I'm OK with holding until resolution of bleeding in urine. I don't think aspirin is appropriate to start if we are actively bleeding. TY.

## 2017-07-08 NOTE — Telephone Encounter (Signed)
Called informed patients wife of response/instructions.

## 2017-07-08 NOTE — Telephone Encounter (Signed)
Copied from CRM [phone]. Topic: General - Other >> Jul 08, 2017  8:46 AM Percival SpanishKennedy, Cheryl W wrote:  Pt will called to ask if she can hold pt rivaroxaban (XARELTO) 20 MG TABS tablet. York SpanielSaid he is still bleeding  and would like to give the pt aspirin instead   Would like a call back     [phone]

## 2017-07-10 ENCOUNTER — Other Ambulatory Visit: Payer: Self-pay | Admitting: Family Medicine

## 2017-07-12 ENCOUNTER — Other Ambulatory Visit: Payer: Self-pay | Admitting: Family Medicine

## 2017-07-29 DIAGNOSIS — R310 Gross hematuria: Secondary | ICD-10-CM | POA: Diagnosis not present

## 2017-07-29 DIAGNOSIS — N312 Flaccid neuropathic bladder, not elsewhere classified: Secondary | ICD-10-CM | POA: Diagnosis not present

## 2017-08-10 ENCOUNTER — Other Ambulatory Visit: Payer: Self-pay | Admitting: Family Medicine

## 2017-09-10 ENCOUNTER — Other Ambulatory Visit: Payer: Self-pay | Admitting: Family Medicine

## 2017-09-29 ENCOUNTER — Other Ambulatory Visit: Payer: Self-pay | Admitting: Neurology

## 2017-10-06 ENCOUNTER — Other Ambulatory Visit: Payer: Self-pay | Admitting: Family Medicine

## 2017-10-08 ENCOUNTER — Telehealth: Payer: Self-pay | Admitting: *Deleted

## 2017-10-08 NOTE — Telephone Encounter (Signed)
Received Physician Orders from [facility]; forwarded to provider/SLS 09/20

## 2017-10-13 DIAGNOSIS — N312 Flaccid neuropathic bladder, not elsewhere classified: Secondary | ICD-10-CM | POA: Diagnosis not present

## 2017-10-13 DIAGNOSIS — R338 Other retention of urine: Secondary | ICD-10-CM | POA: Diagnosis not present

## 2017-10-14 DIAGNOSIS — Z23 Encounter for immunization: Secondary | ICD-10-CM | POA: Diagnosis not present

## 2017-10-26 NOTE — Progress Notes (Signed)
Jacob Tran was seen today in the movement disorders clinic for neurologic consultation at the request of Sharlene Dory, DO.  The consultation is for the evaluation of PD.  This patient is accompanied in the office by his spouse who supplements the history.  Prior neurology records are reviewed but they only go back to 2012, which was a follow up visit.  Wife provides most of history today.  Pt was diagnosed with PD in approximately 2010.  His wife states that he was having trouble standing up and having a problem with his back; he was having injections and those were not helping and referral was made to a neurologist and he was dx with PD.  He was first tried on Mirapex, which caused hallucinations and edema.  Wife thinks that memory problems may have preceeded the diagnosis but is not sure.  Records that I have do not support that.  12/28/14 update:  The patient follows up today, accompanied by his wife who supplements the history.  The patient is on carbidopa/levodopa 25/100, 2 tablets 3 times per day.  I did ask him to hold his medication prior to his visit today but he didn't do that.  He remains on Aricept, 10 mg daily.  I referred him for physical, occupational, and speech therapy since last visit.  It appears that the rehabilitation center called him to schedule it, but the patient never returned their calls. Pt told his wife that he didn't want to go.  No falls.  No hallucinations.  No lightheadeness or near syncope.    I did have the opportunity to review records since our last visit.  The patient was first diagnosed in August, 2010 after noting left greater than right tremor and shuffling.  He was started on Mirapex 1.5 mg.  That did not seem to help and he was subsequently increased to 2.25 mg.  That caused visual hallucinations and it was discontinued.  He was 84 years old at the time.  In April, 2011 levodopa was started.  04/30/15 update:  The patient follows up today, accompanied  by his wife who supplements the history.  The patient has a history of Parkinson's disease with Parkinson's disease dementia.  He is on carbidopa/levodopa 25/100, 2 tablets 3 times per day.  I asked him to hold his medication prior to today's visit, as I have never seen him "off" medication. He states that he is "not so good."  When I asked him what wasn't so good, he states "my breathing."  He did go to physical, occupational and speech therapy since our last visit.  He did well with this.  He doesn't appear to remember this at all.  He has not had falls.  Wife states that he cannot get out of the chair without assist.   He remains on Aricept, 10 mg daily for dementia.  No hallucinations.  10/31/15 update:  Patient follows up today, accompanied by his wife who supplements the history.  I have not seen him in about 6 months.  He has a history of Parkinson's disease and associated Parkinson's disease dementia.  He is on carbidopa/levodopa 25/100, 2 tablets 3 times per day.  His memory is "horrible" per wife but he is off of aricept.  Has 24 hour/day care now as wife works.  Got a lift chair yesterday.  He has not had any hallucinations.  No lightheadedness or near syncope.  Occasionally will get up in the middle of the night to  use the restroom and will "slide" out of the bed.  Otherwise no other falls.    04/30/16 update:  Pt seen today in f/u with his wife and a caregiver who supplements the hx.  Pt on carbidopa/levodopa 25/100, 2 po tid.  Wife states "he has no memory."   Wife states patient has had no visual hallucinations but rarely he will say something about talking to his daughter who [place] in [place].  However, caregiver has noted visual hallucinations.  Playing checking checkers with the air.  Has "sundowning."  Taking clothes out of drawer and throws them in the trash.  Not sleeping at night because has to go to bathroom.  Wife tried to put in cath but he took it out.  He has no falls but sometimes  slides to the floor.  He has a caregiver 5 days a week and then his wife takes care of other days.  No lightheadedness or near syncope.  He is sleeping all day and awake all night.  Cannot keep him in the bed at night.  He will pull the diaper off if they try to keep him in one.  He has trouble turning over at night.  He is in the bed about 15-16 hours per day.    09/03/16 update:  Patient seen today in follow-up for his parkinsonism, accompanied by his 2 caregivers who supplement the history.  Patient is on carbidopa/levodopa 25/100, 2 tablets 3 times per day.  We started quetiapine last visit and worked up to 50 mg at night.  We ended up stopping the titration, primarily because he was having some bladder issues that he was up all night.  He did not care for the urologist that he was seeing.  He ended up following up with his primary care physician for his urology issues.  I have reviewed those notes.  Ultimately, they decided to use nighttime Foley catheter.  That has been helpful and he is sleeping at night with that.  Hallucinations are better and the caregivers think that is because of better sleep.  Wife thought that his seroquel was for bladder and didn't want to continue but when found out for hallucinations they realized it could be helping.    12/25/16 update:  Pt seen in f/u.  Pt presents with caregiver and wife who supplement the history.  Agitation is worse, especially as the sun goes worse.  On seroquel 50 q hs.  Caregiver/wife states that "I cannot tell it helps at all."  Wife frustrated.  Some trouble with taking food in the day and just holding it in the mouth and not swallowing it.    04/27/17 update: Patient is seen today in follow-up.  Patient is with his caregiver and wife who supplies most of the history.  Pt does state that he is "not good." I have talked with a hospice physician about him as well.  Records were also reviewed.  He did not qualify for hospice.  He did qualify for palliative  care.  He is on Seroquel, 25 mg at 4 PM prn (told them to do this scheduled previously) and 50 mg at bedtime. They give the 1/2 tablet a few times per week if agitated and it helps.  It does often put him to sleep.   Wife states that hallucinations seem to come and go.   He was in the hospital for a few days in January due to DVT on the right.  He is now on Xarelto.  No falls.  He walks for exercise - he keeps getting up to go to the bathroom even though he has a catheter in.  He colors, does math books and does puzzles.  Wife reports he does really well with the math books.  Caregivers are in the home most of the time.  He has a good appetite but sometimes he will pocket food in the mouth and not swallow.   He is still on carbidopa/levodopa 2 po tid.  10/28/17 update: Patient is seen today in follow-up for memory change and parkinsonism.  He is accompanied by his wife and a caregiver who provide most of the history.  Patient is on carbidopa/levodopa 25/100, 2 tablets 3 times per day.  Seroquel was increased last visit so that he is now taking 25 mg in the afternoon prn and an additional 50 mg at bedtime.  That afternoon dose seemed to help for agitation, but only needing to use it about 1 time per week.  More trouble eating in that he is pocketing it in his mouth.  Is better with baby food.  Having occasional nightmares and acting out of the dreams.  Caregiver will try to awaken him and he will not wake up and fight with her.  Has a hospital bed, but refuses to use it.  He   Neuroimaging has not previously been performed.   PREVIOUS MEDICATIONS: Mirapex (first drug that was tried per wife and it caused LE edema and hallucinations; carbidopa/levodopa (thinks that it helps tremor)  ALLERGIES:   Allergies  Allergen Reactions  . Mirapex [Pramipexole Dihydrochloride] Swelling    CURRENT MEDICATIONS:  Outpatient Encounter Medications as of 10/28/2017  Medication Sig  . amLODipine (NORVASC) 5 MG tablet  TAKE 1 TABLET BY MOUTH DAILY  . carbidopa-levodopa (SINEMET IR) 25-100 MG tablet TAKE 2 TABLETS BY MOUTH 3  TIMES DAILY  . QUEtiapine (SEROQUEL) 50 MG tablet TAKE 1 TABLET BY MOUTH AT  BEDTIME AND TAKE 1/2 TABLET DURING THE DAY AS NEEDED  . [DISCONTINUED] XARELTO 20 MG TABS tablet TAKE 1 TABLET BY MOUTH  DAILY WITH SUPPER   No facility-administered encounter medications on file as of 10/28/2017.     PAST MEDICAL HISTORY:   Past Medical History:  Diagnosis Date  . 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 Enlarged prostate   . Hyperlipidemia   . Hypertension   . Parkinson's disease (HCC)     PAST SURGICAL HISTORY:   Past Surgical History:  Procedure Laterality Date  . TONSILLECTOMY      SOCIAL HISTORY:   Social History   Socioeconomic History  . Marital status: Married    Spouse name: Not on file  . Number of children: Not on file  . Years of education: Not on file  . Highest education level: Not on file  Occupational History  . Not on file  Social Needs  . Financial resource strain: Not on file  . Food insecurity:    Worry: Not on file    Inability: Not on file  . Transportation needs:    Medical: Not on file    Non-medical: Not on file  Tobacco Use  . Smoking status: Never Smoker  . Smokeless tobacco: Never Used  Substance and Sexual Activity  . Alcohol use: Yes    Alcohol/week: 0.0 standard drinks    Comment: 2 glasses of wine weekly, varies  . Drug use: No  . Sexual activity: Not on file  Lifestyle  .  Physical activity:    Days per week: Not on file    Minutes per session: Not on file  . Stress: Not on file  Relationships  . Social connections:    Talks on phone: Not on file    Gets together: Not on file    Attends religious service: Not on file    Active member of club or organization: Not on file    Attends meetings of clubs or organizations: Not on file    Relationship status: Not on file  . Intimate partner  violence:    Fear of current or ex partner: Not on file    Emotionally abused: Not on file    Physically abused: Not on file    Forced sexual activity: Not on file  Other Topics Concern  . Not on file  Social History Narrative  . Not on file    FAMILY HISTORY:   Family Status  Relation Name Status  . Mother  Deceased  . Father  Deceased  . Daughter  (Not Specified)  . Daughter  (Not Specified)    ROS:  Review of Systems  Unable to perform ROS: Dementia    PHYSICAL EXAMINATION:    VITALS:   Vitals:   10/28/17 1126  BP: 128/60  Pulse: 84  SpO2: 93%   Wt Readings from Last 3 Encounters:  04/27/17 213 lb (96.6 kg)  02/10/17 217 lb (98.4 kg)  01/25/17 220 lb 0.3 oz (99.8 kg)     GEN:  The patient appears stated age and is in NAD. HEENT:  Normocephalic, atraumatic.  The mucous membranes are moist. The superficial temporal arteries are without ropiness or tenderness. CV:  RRR Lungs:  CTAB Neck/HEME:  There are no carotid bruits bilaterally.  Neurological examination:  Orientation: does not/cannot answer questions of orientation.  He is alert.  He follows commands that are simple, but does not answer vocally much [facility] Cognitive Assessment  10/31/2015 12/28/2014  Visuospatial/ Executive (0/5) 1 5  Naming (0/3) 2 2  Attention: Read list of digits (0/2) 2 2  Attention: Read list of letters (0/1) 1 1  Attention: Serial 7 subtraction starting at 100 (0/3) 3 1  Language: Repeat phrase (0/2) 2 2  Language : Fluency (0/1) 0 1  Abstraction (0/2) 2 2  Delayed Recall (0/5) 0 0  Orientation (0/6) 0 1  Total 13 17  Adjusted Score (based on education) 13 17   Cranial nerves: There is good facial symmetry. There is significant facial hypomimia.   He is unable to participate with visual field testing.  He speaks very little today, but when he spoke it was clear.   Sensation: Sensation is intact to light touch throughout. Motor: Strength is at least antigravity x4.  Grip  strength is good and equal bilaterally.   Movement examination: Tone: There is normal tone in the UE Abnormal movements: none Coordination:  There is decremation with RAM's, seen with finger taps, hand opening and closing, bilaterally.  There is decreased RAM's with heel taps and toe taps bilaterally.   Some of this is related to apraxia Gait and Station: Not tested as he is in a wheelchair today.  ASSESSMENT/PLAN:  1.  Parkinson's disease with Parkinson's disease dementia.  -The patient will remain on carbidopa/levodopa 25/100, 2 tablets 3 times per day.  Based on visit today, I wonder if he has vascular parkinsonism, but it is rather academic now and I don't want to change medication now.  -continue seroquel 50  mg at night at 25 mg at 4 pm prn.  We did talk about the fact that the atypical antipsychotic medications are not indicated for dementia related psychosis and increased risk of mortality in the elderly, usually because of infectious or  cardiac related. Understanding is expressed and they were agreeable that the benefits outweigh the risks in this case.  -He is having more difficulty eating, but this is not a swallowing issue this is more of an apraxic issue.  Discussed what this meant with patient/wife/caregiver.  Does better with baby food.  -Using melatonin, 10 mg at night and think that that, along with Seroquel, is helpful for sleep.   2.  Urinary incontinence  -Is using Foley catheter  3.  Follow-up in 6 to 9 months, sooner should new neurologic issues arise.  Much greater than 50% of this visit was spent in counseling and coordinating care.  Total face to face time:  25 min

## 2017-10-28 ENCOUNTER — Encounter: Payer: Self-pay | Admitting: Neurology

## 2017-10-28 ENCOUNTER — Ambulatory Visit (INDEPENDENT_AMBULATORY_CARE_PROVIDER_SITE_OTHER): Payer: Medicare Other | Admitting: Neurology

## 2017-10-28 VITALS — BP 128/60 | HR 84

## 2017-10-28 DIAGNOSIS — F0281 Dementia in other diseases classified elsewhere with behavioral disturbance: Secondary | ICD-10-CM

## 2017-10-28 DIAGNOSIS — G20 Parkinson's disease: Secondary | ICD-10-CM

## 2017-10-28 DIAGNOSIS — F02818 Dementia in other diseases classified elsewhere, unspecified severity, with other behavioral disturbance: Secondary | ICD-10-CM

## 2017-11-02 ENCOUNTER — Other Ambulatory Visit: Payer: Self-pay | Admitting: Family Medicine

## 2017-11-12 DIAGNOSIS — N312 Flaccid neuropathic bladder, not elsewhere classified: Secondary | ICD-10-CM | POA: Diagnosis not present

## 2017-11-30 ENCOUNTER — Other Ambulatory Visit: Payer: Self-pay | Admitting: Family Medicine

## 2017-12-29 ENCOUNTER — Other Ambulatory Visit: Payer: Self-pay | Admitting: Family Medicine

## 2017-12-29 ENCOUNTER — Other Ambulatory Visit: Payer: Self-pay | Admitting: Neurology

## 2018-01-23 ENCOUNTER — Other Ambulatory Visit: Payer: Self-pay | Admitting: Family Medicine

## 2018-01-26 ENCOUNTER — Telehealth: Payer: Self-pay | Admitting: *Deleted

## 2018-01-26 NOTE — Telephone Encounter (Signed)
[facility]; forwarded to provider/SLS 01/08  

## 2018-02-20 ENCOUNTER — Other Ambulatory Visit: Payer: Self-pay | Admitting: Family Medicine

## 2018-02-21 ENCOUNTER — Other Ambulatory Visit: Payer: Self-pay | Admitting: Family Medicine

## 2018-03-08 DIAGNOSIS — H6123 Impacted cerumen, bilateral: Secondary | ICD-10-CM | POA: Diagnosis not present

## 2018-03-29 ENCOUNTER — Telehealth: Payer: Self-pay | Admitting: Family Medicine

## 2018-03-29 ENCOUNTER — Encounter: Payer: Self-pay | Admitting: Hematology & Oncology

## 2018-03-29 DIAGNOSIS — Z0189 Encounter for other specified special examinations: Secondary | ICD-10-CM | POA: Diagnosis not present

## 2018-03-29 NOTE — Telephone Encounter (Unsigned)
Copied from CRM [id number]. Topic: Quick Communication - Home Health Verbal Orders >> Mar 29, 2018  1:20 PM Marylen Ponto wrote: Caller/Agency: Raynelle Fanning with [facility] Callback Number: [phone] Requesting OT/PT/Skilled Nursing/Social Work/Speech Therapy: Requests orders for a urine sample

## 2018-03-30 NOTE — Telephone Encounter (Signed)
Need ok per PCP and order for urine sample

## 2018-03-30 NOTE — Telephone Encounter (Signed)
Called left message to  Call back

## 2018-03-30 NOTE — Telephone Encounter (Signed)
Why?  

## 2018-04-01 ENCOUNTER — Telehealth: Payer: Self-pay | Admitting: *Deleted

## 2018-04-01 ENCOUNTER — Telehealth: Payer: Self-pay | Admitting: Family Medicine

## 2018-04-01 MED ORDER — CEPHALEXIN 500 MG PO CAPS: 500.0000 mg | ORAL_CAPSULE | Freq: Two times a day (BID) | ORAL | 0 refills | Status: DC

## 2018-04-01 NOTE — Telephone Encounter (Signed)
Sent in antibiotic and call HHRN left detailed message antibiotic sent in.

## 2018-04-01 NOTE — Telephone Encounter (Signed)
I have not seen anything

## 2018-04-01 NOTE — Telephone Encounter (Signed)
Received Lab Report results from [facility]; forwarded to provider/SLS  03/13

## 2018-04-01 NOTE — Telephone Encounter (Signed)
Copied from CRM [id number]. Topic: Quick Communication - See Telephone Encounter >> Apr 01, 2018 10:08 AM Jens Som A wrote: CRM for notification. See Telephone encounter for: 04/01/18. [facility] Faxed over urinary lab -indicates that the patient has a UTI- if can give a call back what the Dr would like to prescribe this would be great! [phone]

## 2018-04-04 ENCOUNTER — Telehealth: Payer: Self-pay | Admitting: *Deleted

## 2018-04-04 NOTE — Telephone Encounter (Signed)
Received Lab Report results from [facility]; forwarded to provider/SLS  03/16

## 2018-04-14 ENCOUNTER — Other Ambulatory Visit: Payer: Self-pay | Admitting: Family Medicine

## 2018-04-14 MED ORDER — AMLODIPINE BESYLATE 5 MG PO TABS: 5.0000 mg | ORAL_TABLET | Freq: Every day | ORAL | 0 refills | Status: DC

## 2018-04-14 NOTE — Addendum Note (Signed)
Addended by: Scharlene Gloss B on: 04/14/2018 03:49 PM   Modules accepted: Orders

## 2018-04-28 ENCOUNTER — Telehealth: Payer: Self-pay | Admitting: *Deleted

## 2018-04-28 NOTE — Telephone Encounter (Signed)
Received Physician Orders from [facility]; forwarded to provider/SLS 04/09

## 2018-06-14 ENCOUNTER — Ambulatory Visit (INDEPENDENT_AMBULATORY_CARE_PROVIDER_SITE_OTHER): Payer: Medicare Other | Admitting: Family Medicine

## 2018-06-14 ENCOUNTER — Encounter: Payer: Self-pay | Admitting: Family Medicine

## 2018-06-14 ENCOUNTER — Other Ambulatory Visit: Payer: Self-pay

## 2018-06-14 ENCOUNTER — Telehealth: Payer: Self-pay | Admitting: Neurology

## 2018-06-14 DIAGNOSIS — T50905A Adverse effect of unspecified drugs, medicaments and biological substances, initial encounter: Secondary | ICD-10-CM

## 2018-06-14 NOTE — Progress Notes (Signed)
Chief Complaint  Patient presents with  . Fall    Discuss with wife    Subjective: Patient is a 85 y.o. male here for a fall. Due to COVID-19 pandemic, we are interacting via web portal for an electronic face-to-face visit. I verified patient's ID using 2 identifiers. Patient agreed to proceed with visit via this method. Patient is at home, I am at home. Patient, his wife who provides the hx, and I are present for visit.   Over past 2 weeks, has been very unsteady on feet. Usually right when he stands up he becomes unsteady. Wife is a Engineer, civil (consulting), ck'd BP after he stood up and found it to be 74/43. She stopped giving amlodipine. He has done better since this. Using 1/2 tab if BP elevates. No change in diet. He did have a fall over the weekend and hit his head. No LOC, cut over R eye from glasses. That is much improved. He is acting normal for him.   ROS: Neuro: +stability issues  Past Medical History:  Diagnosis Date  . 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 Enlarged prostate   . Hyperlipidemia   . Hypertension   . Parkinson's disease (HCC)     Objective: No conversational dyspnea Age appropriate judgment and insight Nml affect and mood  Assessment and Plan: Adverse effect of drug, initial encounter  Change Norvasc to 2.5 mg/d. Monitor BP at home. Wife is nurse so this will be straight forward. May need to come off of medicine. Stay hydrated. Neuro rec'd he go to ER (today?). I don't think he needs to be evaluated there given his recovery and unlikely chance of a brain bleed this far out from injury.  Fu in 6 mo otherwise or prn.  [name] voiced understanding and agreement to the plan.  Jilda Roche Prince, DO 06/14/18  4:01 PM

## 2018-06-14 NOTE — Telephone Encounter (Signed)
Called spoke with patient spouse she states that his seizures  isn't new they are not related to the fall or head injury the has been having seizures before you saw him last. He has been having small seizures for months.. She doesn't see the need to take him to the E.R. she states that she is a Engineer, civil (consulting) and understands head injury and brain bleed. She states that the risk on taken him to the E.R. right now is greater then him staying at home.   Pt spouse would not explain what she needs from Dr. Arbutus Leas at this time.

## 2018-06-14 NOTE — Telephone Encounter (Signed)
I have reviewed his records and even put on filters in epic to search for seizure and haven't found anything about seizure.  My recommendations will have to stay the same.  Go to ER to be evaluated.  If wife refuses, would she be willing to do CT brain without contrast as out patient?  We can also probably schedule an evisit sooner than his other visit and I think much safer with him to do an evisit but need to get that imaging complete (and again, I recommend ER)

## 2018-06-14 NOTE — Telephone Encounter (Signed)
Called spoke with patient wife she said on Friday he started having small seizuers. He fell on Friday hit his head she did not take him to E.R or Urgent Care to assess his wound on head. On Saturday he stayed in bed all day. He is a little more alert today. He is starting to hallucinate some. She has notice his BP low. She held his BP medicaiton for 2 weeks. amlodipine 5 MG.  Pt spouse will call PCP about LOW BP.  Please advise

## 2018-06-14 NOTE — Telephone Encounter (Signed)
Pt wife called back she said she was just on the phone with Dr. Carmelia Roller office she will wait to her back from him on what she needs to do.  Pt wife  was wondering if his weakness related to Orthostatic BP  or Parkinson. She states that In the morning when he's getting ready he starts to stand up and falls back down into chair.  She states that the Nurse that with her in the morning this that the head wound came from his glasses not fall for head injury.   Pt spouse will call back to scheduled appt if Dr. Carmelia Roller recommends it.

## 2018-06-14 NOTE — Telephone Encounter (Signed)
She has my recommendations, ER and if declines, CT.  Visit has also been offered here.

## 2018-06-14 NOTE — Telephone Encounter (Signed)
Patient wife needs to talk to someone about what is going on with the patient. He fell and blood pressure dropping and seizures are happening also.   He has appt on 07-01-18 ( not a evisit )  They wanted to be seen in the office due to them not sure what she will do over the evisit  Please call patient wife

## 2018-06-14 NOTE — Telephone Encounter (Signed)
Called patient spouse no answer left provider response on voice mail Waiting return call

## 2018-06-14 NOTE — Telephone Encounter (Signed)
If she thinks that he is having seizures (what makes her think that) after hitting his head, then he needs to go to the ER immediately, now!  This could be a bleed on the brain.

## 2018-06-24 DIAGNOSIS — R300 Dysuria: Secondary | ICD-10-CM | POA: Diagnosis not present

## 2018-06-27 ENCOUNTER — Telehealth: Payer: Self-pay

## 2018-06-27 NOTE — Telephone Encounter (Signed)
OK 

## 2018-06-27 NOTE — Telephone Encounter (Signed)
Informed.// test negative

## 2018-06-27 NOTE — Telephone Encounter (Signed)
Copied from [place] [id number]. Topic: General - Other >> Jun 24, 2018  4:34 PM Rainey Pines A wrote: Almyra Free from [facility] was requesting a verbal order from Dr. Nani Ravens for a UACNS. Almyra Free would like a callback at [phone]

## 2018-06-28 NOTE — Progress Notes (Signed)
Virtual Visit via Video Note The purpose of this virtual visit is to provide medical care while limiting exposure to the novel coronavirus.    Consent was obtained for video visit:  Yes.   Answered questions that patient had about telehealth interaction:  Yes.   I discussed the limitations, risks, security and privacy concerns of performing an evaluation and management service by telemedicine. I also discussed with the patient that there may be a patient responsible charge related to this service. The patient expressed understanding and agreed to proceed.  Pt location: Home Physician Location: home Name of referring provider:  Shelda Pal* I connected with Jacob Tran at patients initiation/request on 07/01/2018 at  1:00 PM EDT by video enabled telemedicine application and verified that I am speaking with the correct person using two identifiers. Pt MRN:  [id number] Pt DOB:  [birth year] Video Participants:  Jacob Tran;  Wife supplies all of hx   History of Present Illness:  Patient seen today in follow-up for Parkinson's with Parkinson's disease dementia.  Patient is on carbidopa/levodopa 25/100, 2 tablets 3 times per day.  He is also on quetiapine,  50 mg at bedtime and they are rarely giving the 25 mg at 4pm (one time per month).   Are noticing pt grabbing out into the air and caregiver thinks that he is seeing things and trying to grab them.   Wife called on May 26 stating that the patient had fallen and hit his head and she felt he was having seizures and increased hallucinations.  I explained that if he was having seizures post hitting his head, then he needed to go to the emergency room immediately.  His wife told my staff that the seizures were not "new or related to the fall and that he had been having small seizures for months."  I explained that I was not aware of any of this and that I recommended that he go to the ER.  However, she refused and since she did so, I  recommended that we do a CT of the brain, but the patient's wife stated that she would rather get recommendations from his primary care physician and stated that she would let me know if the primary care physician recommended it or not.  I also offered an Evisit, which was declined.  I was happy to see that the patient did see the patient's primary care physician via video visit on that same day.  Wife did tell the primary care physician that the patient hit his head with a fall.  She also noted that day to the PCP that the patient has been very unsteady on his feet and blood pressures have been low, all the way down to the 48J systolic.  Therefore, primary care reduced his amlodipine to 2.5 mg/day and told to monitor blood pressure.  He was told that he did not need to be evaluated in the emergency room.  Wife thinks not quite as weak since lowering/holding BP med but still states that pt very weak and asks if carbidopa/levodopa 25/100 and seroquel causing weakness.     Current Outpatient Medications on File Prior to Visit  Medication Sig Dispense Refill   amLODipine (NORVASC) 5 MG tablet Take 0.5 tablets (2.5 mg total) by mouth daily. 90 tablet 0   carbidopa-levodopa (SINEMET IR) 25-100 MG tablet TAKE 2 TABLETS BY MOUTH 3  TIMES DAILY 540 tablet 1   QUEtiapine (SEROQUEL) 50 MG tablet TAKE 1 TABLET BY MOUTH AT  BEDTIME AND TAKE 1/2 TABLET DURING THE DAY AS NEEDED 135 tablet 1   No current facility-administered medications on file prior to visit.    Review of Systems  Unable to perform ROS: Dementia  wife states trouble swallowing and significant weight loss (32 lb weight loss).  Palliative looking into possible hospice  Observations/Objective:   Vitals:   07/01/18 0821  Weight: 185 lb (83.9 kg)   Pt asleep and wife doesn't wish to wake him to allow for examination   Assessment and Plan:   1.  Parkinson's with Parkinson's disease dementia  -discussed trying to lower the carbidopa/levodopa  25/100 due to weakness and OH but also told wife that it could make it more difficult to ambulate.  We decided to try carbidopa/levodopa 25/100, 2/1/1 instead of carbidopa/levodopa 25/100, 2 po tid for a week.  They will let me know how he does.  -Continue quetiapine, 50 mg at bedtime.  He was on 25 mg at 4 PM as well, but she really gives this to him.  Discussed that he likely is reaching an error because of trying to grab at things that he sees, and the caregiver agrees.  Told his wife that she may want to try giving the 4 PM dose of 25 mg of Seroquel if he becomes agitated with the hallucinations and see how it works.  -Patient is under the care of palliative care.  Sounds like he is eating much less with significant weight loss according to his wife.  Palliative care was at the house last week and wife states that they are considering having him under hospice care, which may be appropriate at this point in time.  She states that they are in the decision phase and I will leave this up to the medical director of hospice.  2.  Lightheadedness, due to hypotension  -Primary care recently reduced his Norvasc.  Follow Up Instructions: 6-8 months  -I discussed the assessment and treatment plan with the patient/wife. The patient/wife was provided an opportunity to ask questions and all were answered. The patient/wife agreed with the plan and demonstrated an understanding of the instructions.   The patient was advised to call back or seek an in-person evaluation if the symptoms worsen or if the condition fails to improve as anticipated.    Total Time spent in visit with the patient was:  15 min, of which more than 50% of the time was spent in counseling.   Pt understands and agrees with the plan of care outlined.     Kerin Salenebecca Almer Bushey, DO

## 2018-06-29 ENCOUNTER — Telehealth: Payer: Self-pay | Admitting: Neurology

## 2018-06-29 NOTE — Telephone Encounter (Signed)
Pt's wife returned your call. Pls cal her again.

## 2018-06-29 NOTE — Telephone Encounter (Signed)
Called spoke with wife nancy Pt chart updated for appt on friday

## 2018-06-29 NOTE — Telephone Encounter (Signed)
Called to triage patient prior to appt on friday

## 2018-07-01 ENCOUNTER — Encounter: Payer: Self-pay | Admitting: Neurology

## 2018-07-01 ENCOUNTER — Telehealth (INDEPENDENT_AMBULATORY_CARE_PROVIDER_SITE_OTHER): Payer: Medicare Other | Admitting: Neurology

## 2018-07-01 ENCOUNTER — Other Ambulatory Visit: Payer: Self-pay

## 2018-07-01 DIAGNOSIS — G20 Parkinson's disease: Secondary | ICD-10-CM | POA: Diagnosis not present

## 2018-07-01 DIAGNOSIS — F0281 Dementia in other diseases classified elsewhere with behavioral disturbance: Secondary | ICD-10-CM

## 2018-07-08 DIAGNOSIS — R159 Full incontinence of feces: Secondary | ICD-10-CM | POA: Diagnosis not present

## 2018-07-08 DIAGNOSIS — G20 Parkinson's disease: Secondary | ICD-10-CM | POA: Diagnosis not present

## 2018-07-08 DIAGNOSIS — G4700 Insomnia, unspecified: Secondary | ICD-10-CM | POA: Diagnosis not present

## 2018-07-08 DIAGNOSIS — Z515 Encounter for palliative care: Secondary | ICD-10-CM | POA: Diagnosis not present

## 2018-07-08 DIAGNOSIS — R451 Restlessness and agitation: Secondary | ICD-10-CM | POA: Diagnosis not present

## 2018-07-11 ENCOUNTER — Telehealth: Payer: Self-pay

## 2018-07-11 NOTE — Telephone Encounter (Signed)
Copied from [place] [id number] [phone]. Topic: General - Inquiry >> Jul 08, 2018  4:15 PM Richardo Priest, NT wrote: Reason for CRM: Tammy, RN with hospice, called in stating that the patient is currently enrolled in their palliative care program, however a virtual visit with the director was held and they agreed he is eligable for the hospice care program. States that they are wondering if Dr.Wendling would agree to this and write an order, would like to be the attending Hospice physician or if he would like another provider to do so, and if he decided to be the acting physician, would he like the staff to help with care management for the patient. Please advise. Call back is [phone]. Line is secure and voicemail is fine.

## 2018-07-11 NOTE — Telephone Encounter (Signed)
callled Hospice and informed PCP ok with ... No order needed at this time, their attending will do for now. His wife is in agreement.

## 2018-07-11 NOTE — Telephone Encounter (Signed)
Is his wife, Izora Gala, in agreement with hospice? Happy to write order. Usually I defer to a hospice physician, but am happy to help when requested. Ty.

## 2018-07-12 DIAGNOSIS — R1310 Dysphagia, unspecified: Secondary | ICD-10-CM | POA: Diagnosis not present

## 2018-07-12 DIAGNOSIS — G40909 Epilepsy, unspecified, not intractable, without status epilepticus: Secondary | ICD-10-CM | POA: Diagnosis not present

## 2018-07-12 DIAGNOSIS — Z436 Encounter for attention to other artificial openings of urinary tract: Secondary | ICD-10-CM | POA: Diagnosis not present

## 2018-07-12 DIAGNOSIS — R339 Retention of urine, unspecified: Secondary | ICD-10-CM | POA: Diagnosis not present

## 2018-07-12 DIAGNOSIS — R634 Abnormal weight loss: Secondary | ICD-10-CM | POA: Diagnosis not present

## 2018-07-12 DIAGNOSIS — I10 Essential (primary) hypertension: Secondary | ICD-10-CM | POA: Diagnosis not present

## 2018-07-12 DIAGNOSIS — G20 Parkinson's disease: Secondary | ICD-10-CM | POA: Diagnosis not present

## 2018-07-12 DIAGNOSIS — N401 Enlarged prostate with lower urinary tract symptoms: Secondary | ICD-10-CM | POA: Diagnosis not present

## 2018-07-12 DIAGNOSIS — E785 Hyperlipidemia, unspecified: Secondary | ICD-10-CM | POA: Diagnosis not present

## 2018-07-13 DIAGNOSIS — G20 Parkinson's disease: Secondary | ICD-10-CM | POA: Diagnosis not present

## 2018-07-13 DIAGNOSIS — R339 Retention of urine, unspecified: Secondary | ICD-10-CM | POA: Diagnosis not present

## 2018-07-13 DIAGNOSIS — R1310 Dysphagia, unspecified: Secondary | ICD-10-CM | POA: Diagnosis not present

## 2018-07-13 DIAGNOSIS — R634 Abnormal weight loss: Secondary | ICD-10-CM | POA: Diagnosis not present

## 2018-07-13 DIAGNOSIS — N401 Enlarged prostate with lower urinary tract symptoms: Secondary | ICD-10-CM | POA: Diagnosis not present

## 2018-07-13 DIAGNOSIS — G40909 Epilepsy, unspecified, not intractable, without status epilepticus: Secondary | ICD-10-CM | POA: Diagnosis not present

## 2018-07-19 DIAGNOSIS — N401 Enlarged prostate with lower urinary tract symptoms: Secondary | ICD-10-CM | POA: Diagnosis not present

## 2018-07-19 DIAGNOSIS — G40909 Epilepsy, unspecified, not intractable, without status epilepticus: Secondary | ICD-10-CM | POA: Diagnosis not present

## 2018-07-19 DIAGNOSIS — G20 Parkinson's disease: Secondary | ICD-10-CM | POA: Diagnosis not present

## 2018-07-19 DIAGNOSIS — R1310 Dysphagia, unspecified: Secondary | ICD-10-CM | POA: Diagnosis not present

## 2018-07-19 DIAGNOSIS — R634 Abnormal weight loss: Secondary | ICD-10-CM | POA: Diagnosis not present

## 2018-07-19 DIAGNOSIS — R339 Retention of urine, unspecified: Secondary | ICD-10-CM | POA: Diagnosis not present

## 2018-07-20 DIAGNOSIS — R339 Retention of urine, unspecified: Secondary | ICD-10-CM | POA: Diagnosis not present

## 2018-07-20 DIAGNOSIS — I10 Essential (primary) hypertension: Secondary | ICD-10-CM | POA: Diagnosis not present

## 2018-07-20 DIAGNOSIS — R634 Abnormal weight loss: Secondary | ICD-10-CM | POA: Diagnosis not present

## 2018-07-20 DIAGNOSIS — N401 Enlarged prostate with lower urinary tract symptoms: Secondary | ICD-10-CM | POA: Diagnosis not present

## 2018-07-20 DIAGNOSIS — R1310 Dysphagia, unspecified: Secondary | ICD-10-CM | POA: Diagnosis not present

## 2018-07-20 DIAGNOSIS — Z436 Encounter for attention to other artificial openings of urinary tract: Secondary | ICD-10-CM | POA: Diagnosis not present

## 2018-07-20 DIAGNOSIS — G20 Parkinson's disease: Secondary | ICD-10-CM | POA: Diagnosis not present

## 2018-07-20 DIAGNOSIS — G40909 Epilepsy, unspecified, not intractable, without status epilepticus: Secondary | ICD-10-CM | POA: Diagnosis not present

## 2018-07-20 DIAGNOSIS — E785 Hyperlipidemia, unspecified: Secondary | ICD-10-CM | POA: Diagnosis not present

## 2018-08-01 DIAGNOSIS — N401 Enlarged prostate with lower urinary tract symptoms: Secondary | ICD-10-CM | POA: Diagnosis not present

## 2018-08-01 DIAGNOSIS — R634 Abnormal weight loss: Secondary | ICD-10-CM | POA: Diagnosis not present

## 2018-08-01 DIAGNOSIS — R1310 Dysphagia, unspecified: Secondary | ICD-10-CM | POA: Diagnosis not present

## 2018-08-01 DIAGNOSIS — R339 Retention of urine, unspecified: Secondary | ICD-10-CM | POA: Diagnosis not present

## 2018-08-01 DIAGNOSIS — G20 Parkinson's disease: Secondary | ICD-10-CM | POA: Diagnosis not present

## 2018-08-01 DIAGNOSIS — G40909 Epilepsy, unspecified, not intractable, without status epilepticus: Secondary | ICD-10-CM | POA: Diagnosis not present

## 2018-08-05 ENCOUNTER — Other Ambulatory Visit: Payer: Self-pay | Admitting: Neurology

## 2018-08-05 NOTE — Telephone Encounter (Signed)
Requested Prescriptions   Pending Prescriptions Disp Refills  . QUEtiapine (SEROQUEL) 50 MG tablet [Pharmacy Med Name: QUETIAPINE  50MG   TAB] 135 tablet 1    Sig: TAKE 1 TABLET BY MOUTH AT  BEDTIME AND TAKE 1/2 TABLET DURING THE DAY AS NEEDED  . carbidopa-levodopa (SINEMET IR) 25-100 MG tablet [Pharmacy Med Name: CARB LEVODOPA  25MG  100MG   TAB] 540 tablet 1    Sig: TAKE 2 TABLETS BY MOUTH 3  TIMES DAILY   Rx last filled: 12/29/17 #135 1 refills; 12/29/17 #540 1 refills  Pt last seen: 07/01/18  Follow up appt scheduled: 01/02/19

## 2018-08-08 DIAGNOSIS — H6123 Impacted cerumen, bilateral: Secondary | ICD-10-CM | POA: Diagnosis not present

## 2018-08-19 DIAGNOSIS — N401 Enlarged prostate with lower urinary tract symptoms: Secondary | ICD-10-CM | POA: Diagnosis not present

## 2018-08-19 DIAGNOSIS — G40909 Epilepsy, unspecified, not intractable, without status epilepticus: Secondary | ICD-10-CM | POA: Diagnosis not present

## 2018-08-19 DIAGNOSIS — R339 Retention of urine, unspecified: Secondary | ICD-10-CM | POA: Diagnosis not present

## 2018-08-19 DIAGNOSIS — G20 Parkinson's disease: Secondary | ICD-10-CM | POA: Diagnosis not present

## 2018-08-19 DIAGNOSIS — R634 Abnormal weight loss: Secondary | ICD-10-CM | POA: Diagnosis not present

## 2018-08-19 DIAGNOSIS — R1310 Dysphagia, unspecified: Secondary | ICD-10-CM | POA: Diagnosis not present

## 2018-08-20 DIAGNOSIS — N401 Enlarged prostate with lower urinary tract symptoms: Secondary | ICD-10-CM | POA: Diagnosis not present

## 2018-08-20 DIAGNOSIS — R634 Abnormal weight loss: Secondary | ICD-10-CM | POA: Diagnosis not present

## 2018-08-20 DIAGNOSIS — Z436 Encounter for attention to other artificial openings of urinary tract: Secondary | ICD-10-CM | POA: Diagnosis not present

## 2018-08-20 DIAGNOSIS — G40909 Epilepsy, unspecified, not intractable, without status epilepticus: Secondary | ICD-10-CM | POA: Diagnosis not present

## 2018-08-20 DIAGNOSIS — E785 Hyperlipidemia, unspecified: Secondary | ICD-10-CM | POA: Diagnosis not present

## 2018-08-20 DIAGNOSIS — R339 Retention of urine, unspecified: Secondary | ICD-10-CM | POA: Diagnosis not present

## 2018-08-20 DIAGNOSIS — G20 Parkinson's disease: Secondary | ICD-10-CM | POA: Diagnosis not present

## 2018-08-20 DIAGNOSIS — R1310 Dysphagia, unspecified: Secondary | ICD-10-CM | POA: Diagnosis not present

## 2018-08-20 DIAGNOSIS — I10 Essential (primary) hypertension: Secondary | ICD-10-CM | POA: Diagnosis not present

## 2018-08-30 DIAGNOSIS — R1310 Dysphagia, unspecified: Secondary | ICD-10-CM | POA: Diagnosis not present

## 2018-08-30 DIAGNOSIS — N401 Enlarged prostate with lower urinary tract symptoms: Secondary | ICD-10-CM | POA: Diagnosis not present

## 2018-08-30 DIAGNOSIS — G40909 Epilepsy, unspecified, not intractable, without status epilepticus: Secondary | ICD-10-CM | POA: Diagnosis not present

## 2018-08-30 DIAGNOSIS — R634 Abnormal weight loss: Secondary | ICD-10-CM | POA: Diagnosis not present

## 2018-08-30 DIAGNOSIS — R339 Retention of urine, unspecified: Secondary | ICD-10-CM | POA: Diagnosis not present

## 2018-08-30 DIAGNOSIS — G20 Parkinson's disease: Secondary | ICD-10-CM | POA: Diagnosis not present

## 2018-09-14 DIAGNOSIS — N401 Enlarged prostate with lower urinary tract symptoms: Secondary | ICD-10-CM | POA: Diagnosis not present

## 2018-09-14 DIAGNOSIS — R339 Retention of urine, unspecified: Secondary | ICD-10-CM | POA: Diagnosis not present

## 2018-09-14 DIAGNOSIS — R634 Abnormal weight loss: Secondary | ICD-10-CM | POA: Diagnosis not present

## 2018-09-14 DIAGNOSIS — R1310 Dysphagia, unspecified: Secondary | ICD-10-CM | POA: Diagnosis not present

## 2018-09-14 DIAGNOSIS — G40909 Epilepsy, unspecified, not intractable, without status epilepticus: Secondary | ICD-10-CM | POA: Diagnosis not present

## 2018-09-14 DIAGNOSIS — G20 Parkinson's disease: Secondary | ICD-10-CM | POA: Diagnosis not present

## 2018-09-20 DIAGNOSIS — E785 Hyperlipidemia, unspecified: Secondary | ICD-10-CM | POA: Diagnosis not present

## 2018-09-20 DIAGNOSIS — R634 Abnormal weight loss: Secondary | ICD-10-CM | POA: Diagnosis not present

## 2018-09-20 DIAGNOSIS — G40909 Epilepsy, unspecified, not intractable, without status epilepticus: Secondary | ICD-10-CM | POA: Diagnosis not present

## 2018-09-20 DIAGNOSIS — I10 Essential (primary) hypertension: Secondary | ICD-10-CM | POA: Diagnosis not present

## 2018-09-20 DIAGNOSIS — N401 Enlarged prostate with lower urinary tract symptoms: Secondary | ICD-10-CM | POA: Diagnosis not present

## 2018-09-20 DIAGNOSIS — Z436 Encounter for attention to other artificial openings of urinary tract: Secondary | ICD-10-CM | POA: Diagnosis not present

## 2018-09-20 DIAGNOSIS — G20 Parkinson's disease: Secondary | ICD-10-CM | POA: Diagnosis not present

## 2018-09-20 DIAGNOSIS — R339 Retention of urine, unspecified: Secondary | ICD-10-CM | POA: Diagnosis not present

## 2018-09-20 DIAGNOSIS — R1310 Dysphagia, unspecified: Secondary | ICD-10-CM | POA: Diagnosis not present

## 2018-10-06 DIAGNOSIS — N401 Enlarged prostate with lower urinary tract symptoms: Secondary | ICD-10-CM | POA: Diagnosis not present

## 2018-10-06 DIAGNOSIS — R1310 Dysphagia, unspecified: Secondary | ICD-10-CM | POA: Diagnosis not present

## 2018-10-06 DIAGNOSIS — R339 Retention of urine, unspecified: Secondary | ICD-10-CM | POA: Diagnosis not present

## 2018-10-06 DIAGNOSIS — G40909 Epilepsy, unspecified, not intractable, without status epilepticus: Secondary | ICD-10-CM | POA: Diagnosis not present

## 2018-10-06 DIAGNOSIS — G20 Parkinson's disease: Secondary | ICD-10-CM | POA: Diagnosis not present

## 2018-10-06 DIAGNOSIS — R634 Abnormal weight loss: Secondary | ICD-10-CM | POA: Diagnosis not present

## 2018-10-10 DIAGNOSIS — R1310 Dysphagia, unspecified: Secondary | ICD-10-CM | POA: Diagnosis not present

## 2018-10-10 DIAGNOSIS — R339 Retention of urine, unspecified: Secondary | ICD-10-CM | POA: Diagnosis not present

## 2018-10-10 DIAGNOSIS — N401 Enlarged prostate with lower urinary tract symptoms: Secondary | ICD-10-CM | POA: Diagnosis not present

## 2018-10-10 DIAGNOSIS — R634 Abnormal weight loss: Secondary | ICD-10-CM | POA: Diagnosis not present

## 2018-10-10 DIAGNOSIS — G20 Parkinson's disease: Secondary | ICD-10-CM | POA: Diagnosis not present

## 2018-10-10 DIAGNOSIS — G40909 Epilepsy, unspecified, not intractable, without status epilepticus: Secondary | ICD-10-CM | POA: Diagnosis not present

## 2018-10-13 DIAGNOSIS — G40909 Epilepsy, unspecified, not intractable, without status epilepticus: Secondary | ICD-10-CM | POA: Diagnosis not present

## 2018-10-13 DIAGNOSIS — R634 Abnormal weight loss: Secondary | ICD-10-CM | POA: Diagnosis not present

## 2018-10-13 DIAGNOSIS — R1310 Dysphagia, unspecified: Secondary | ICD-10-CM | POA: Diagnosis not present

## 2018-10-13 DIAGNOSIS — G20 Parkinson's disease: Secondary | ICD-10-CM | POA: Diagnosis not present

## 2018-10-13 DIAGNOSIS — R339 Retention of urine, unspecified: Secondary | ICD-10-CM | POA: Diagnosis not present

## 2018-10-13 DIAGNOSIS — N401 Enlarged prostate with lower urinary tract symptoms: Secondary | ICD-10-CM | POA: Diagnosis not present

## 2018-10-18 DIAGNOSIS — R634 Abnormal weight loss: Secondary | ICD-10-CM | POA: Diagnosis not present

## 2018-10-18 DIAGNOSIS — G20 Parkinson's disease: Secondary | ICD-10-CM | POA: Diagnosis not present

## 2018-10-18 DIAGNOSIS — R339 Retention of urine, unspecified: Secondary | ICD-10-CM | POA: Diagnosis not present

## 2018-10-18 DIAGNOSIS — N401 Enlarged prostate with lower urinary tract symptoms: Secondary | ICD-10-CM | POA: Diagnosis not present

## 2018-10-18 DIAGNOSIS — R1310 Dysphagia, unspecified: Secondary | ICD-10-CM | POA: Diagnosis not present

## 2018-10-18 DIAGNOSIS — G40909 Epilepsy, unspecified, not intractable, without status epilepticus: Secondary | ICD-10-CM | POA: Diagnosis not present

## 2018-10-20 DIAGNOSIS — G40909 Epilepsy, unspecified, not intractable, without status epilepticus: Secondary | ICD-10-CM | POA: Diagnosis not present

## 2018-10-20 DIAGNOSIS — Z436 Encounter for attention to other artificial openings of urinary tract: Secondary | ICD-10-CM | POA: Diagnosis not present

## 2018-10-20 DIAGNOSIS — R634 Abnormal weight loss: Secondary | ICD-10-CM | POA: Diagnosis not present

## 2018-10-20 DIAGNOSIS — I10 Essential (primary) hypertension: Secondary | ICD-10-CM | POA: Diagnosis not present

## 2018-10-20 DIAGNOSIS — N401 Enlarged prostate with lower urinary tract symptoms: Secondary | ICD-10-CM | POA: Diagnosis not present

## 2018-10-20 DIAGNOSIS — E785 Hyperlipidemia, unspecified: Secondary | ICD-10-CM | POA: Diagnosis not present

## 2018-10-20 DIAGNOSIS — R1310 Dysphagia, unspecified: Secondary | ICD-10-CM | POA: Diagnosis not present

## 2018-10-20 DIAGNOSIS — R339 Retention of urine, unspecified: Secondary | ICD-10-CM | POA: Diagnosis not present

## 2018-10-20 DIAGNOSIS — G20 Parkinson's disease: Secondary | ICD-10-CM | POA: Diagnosis not present

## 2018-10-21 DIAGNOSIS — G20 Parkinson's disease: Secondary | ICD-10-CM | POA: Diagnosis not present

## 2018-10-21 DIAGNOSIS — R339 Retention of urine, unspecified: Secondary | ICD-10-CM | POA: Diagnosis not present

## 2018-10-21 DIAGNOSIS — G40909 Epilepsy, unspecified, not intractable, without status epilepticus: Secondary | ICD-10-CM | POA: Diagnosis not present

## 2018-10-21 DIAGNOSIS — R1310 Dysphagia, unspecified: Secondary | ICD-10-CM | POA: Diagnosis not present

## 2018-10-21 DIAGNOSIS — N401 Enlarged prostate with lower urinary tract symptoms: Secondary | ICD-10-CM | POA: Diagnosis not present

## 2018-10-21 DIAGNOSIS — R634 Abnormal weight loss: Secondary | ICD-10-CM | POA: Diagnosis not present

## 2018-10-25 DIAGNOSIS — G40909 Epilepsy, unspecified, not intractable, without status epilepticus: Secondary | ICD-10-CM | POA: Diagnosis not present

## 2018-10-25 DIAGNOSIS — N401 Enlarged prostate with lower urinary tract symptoms: Secondary | ICD-10-CM | POA: Diagnosis not present

## 2018-10-25 DIAGNOSIS — G20 Parkinson's disease: Secondary | ICD-10-CM | POA: Diagnosis not present

## 2018-10-25 DIAGNOSIS — R1310 Dysphagia, unspecified: Secondary | ICD-10-CM | POA: Diagnosis not present

## 2018-10-25 DIAGNOSIS — R339 Retention of urine, unspecified: Secondary | ICD-10-CM | POA: Diagnosis not present

## 2018-10-25 DIAGNOSIS — R634 Abnormal weight loss: Secondary | ICD-10-CM | POA: Diagnosis not present

## 2018-11-01 DIAGNOSIS — R1310 Dysphagia, unspecified: Secondary | ICD-10-CM | POA: Diagnosis not present

## 2018-11-01 DIAGNOSIS — R339 Retention of urine, unspecified: Secondary | ICD-10-CM | POA: Diagnosis not present

## 2018-11-01 DIAGNOSIS — N401 Enlarged prostate with lower urinary tract symptoms: Secondary | ICD-10-CM | POA: Diagnosis not present

## 2018-11-01 DIAGNOSIS — G40909 Epilepsy, unspecified, not intractable, without status epilepticus: Secondary | ICD-10-CM | POA: Diagnosis not present

## 2018-11-01 DIAGNOSIS — R634 Abnormal weight loss: Secondary | ICD-10-CM | POA: Diagnosis not present

## 2018-11-01 DIAGNOSIS — G20 Parkinson's disease: Secondary | ICD-10-CM | POA: Diagnosis not present

## 2018-11-07 DIAGNOSIS — Z23 Encounter for immunization: Secondary | ICD-10-CM | POA: Diagnosis not present

## 2018-11-08 DIAGNOSIS — R1310 Dysphagia, unspecified: Secondary | ICD-10-CM | POA: Diagnosis not present

## 2018-11-08 DIAGNOSIS — N401 Enlarged prostate with lower urinary tract symptoms: Secondary | ICD-10-CM | POA: Diagnosis not present

## 2018-11-08 DIAGNOSIS — R339 Retention of urine, unspecified: Secondary | ICD-10-CM | POA: Diagnosis not present

## 2018-11-08 DIAGNOSIS — G20 Parkinson's disease: Secondary | ICD-10-CM | POA: Diagnosis not present

## 2018-11-08 DIAGNOSIS — R634 Abnormal weight loss: Secondary | ICD-10-CM | POA: Diagnosis not present

## 2018-11-08 DIAGNOSIS — G40909 Epilepsy, unspecified, not intractable, without status epilepticus: Secondary | ICD-10-CM | POA: Diagnosis not present

## 2018-11-16 DIAGNOSIS — R1310 Dysphagia, unspecified: Secondary | ICD-10-CM | POA: Diagnosis not present

## 2018-11-16 DIAGNOSIS — R339 Retention of urine, unspecified: Secondary | ICD-10-CM | POA: Diagnosis not present

## 2018-11-16 DIAGNOSIS — N401 Enlarged prostate with lower urinary tract symptoms: Secondary | ICD-10-CM | POA: Diagnosis not present

## 2018-11-16 DIAGNOSIS — G20 Parkinson's disease: Secondary | ICD-10-CM | POA: Diagnosis not present

## 2018-11-16 DIAGNOSIS — R634 Abnormal weight loss: Secondary | ICD-10-CM | POA: Diagnosis not present

## 2018-11-16 DIAGNOSIS — G40909 Epilepsy, unspecified, not intractable, without status epilepticus: Secondary | ICD-10-CM | POA: Diagnosis not present

## 2018-11-20 DIAGNOSIS — Z436 Encounter for attention to other artificial openings of urinary tract: Secondary | ICD-10-CM | POA: Diagnosis not present

## 2018-11-20 DIAGNOSIS — R339 Retention of urine, unspecified: Secondary | ICD-10-CM | POA: Diagnosis not present

## 2018-11-20 DIAGNOSIS — G20 Parkinson's disease: Secondary | ICD-10-CM | POA: Diagnosis not present

## 2018-11-20 DIAGNOSIS — I10 Essential (primary) hypertension: Secondary | ICD-10-CM | POA: Diagnosis not present

## 2018-11-20 DIAGNOSIS — E785 Hyperlipidemia, unspecified: Secondary | ICD-10-CM | POA: Diagnosis not present

## 2018-11-20 DIAGNOSIS — N401 Enlarged prostate with lower urinary tract symptoms: Secondary | ICD-10-CM | POA: Diagnosis not present

## 2018-11-20 DIAGNOSIS — G40909 Epilepsy, unspecified, not intractable, without status epilepticus: Secondary | ICD-10-CM | POA: Diagnosis not present

## 2018-11-20 DIAGNOSIS — R634 Abnormal weight loss: Secondary | ICD-10-CM | POA: Diagnosis not present

## 2018-11-20 DIAGNOSIS — R1310 Dysphagia, unspecified: Secondary | ICD-10-CM | POA: Diagnosis not present

## 2018-11-22 DIAGNOSIS — N401 Enlarged prostate with lower urinary tract symptoms: Secondary | ICD-10-CM | POA: Diagnosis not present

## 2018-11-22 DIAGNOSIS — R339 Retention of urine, unspecified: Secondary | ICD-10-CM | POA: Diagnosis not present

## 2018-11-22 DIAGNOSIS — G40909 Epilepsy, unspecified, not intractable, without status epilepticus: Secondary | ICD-10-CM | POA: Diagnosis not present

## 2018-11-22 DIAGNOSIS — R1310 Dysphagia, unspecified: Secondary | ICD-10-CM | POA: Diagnosis not present

## 2018-11-22 DIAGNOSIS — G20 Parkinson's disease: Secondary | ICD-10-CM | POA: Diagnosis not present

## 2018-11-22 DIAGNOSIS — R634 Abnormal weight loss: Secondary | ICD-10-CM | POA: Diagnosis not present

## 2018-11-28 DIAGNOSIS — R1310 Dysphagia, unspecified: Secondary | ICD-10-CM | POA: Diagnosis not present

## 2018-11-28 DIAGNOSIS — R339 Retention of urine, unspecified: Secondary | ICD-10-CM | POA: Diagnosis not present

## 2018-11-28 DIAGNOSIS — G20 Parkinson's disease: Secondary | ICD-10-CM | POA: Diagnosis not present

## 2018-11-28 DIAGNOSIS — N401 Enlarged prostate with lower urinary tract symptoms: Secondary | ICD-10-CM | POA: Diagnosis not present

## 2018-11-28 DIAGNOSIS — G40909 Epilepsy, unspecified, not intractable, without status epilepticus: Secondary | ICD-10-CM | POA: Diagnosis not present

## 2018-11-28 DIAGNOSIS — R634 Abnormal weight loss: Secondary | ICD-10-CM | POA: Diagnosis not present

## 2018-11-29 DIAGNOSIS — R1310 Dysphagia, unspecified: Secondary | ICD-10-CM | POA: Diagnosis not present

## 2018-11-29 DIAGNOSIS — R339 Retention of urine, unspecified: Secondary | ICD-10-CM | POA: Diagnosis not present

## 2018-11-29 DIAGNOSIS — R634 Abnormal weight loss: Secondary | ICD-10-CM | POA: Diagnosis not present

## 2018-11-29 DIAGNOSIS — N401 Enlarged prostate with lower urinary tract symptoms: Secondary | ICD-10-CM | POA: Diagnosis not present

## 2018-11-29 DIAGNOSIS — G20 Parkinson's disease: Secondary | ICD-10-CM | POA: Diagnosis not present

## 2018-11-29 DIAGNOSIS — G40909 Epilepsy, unspecified, not intractable, without status epilepticus: Secondary | ICD-10-CM | POA: Diagnosis not present

## 2018-12-06 DIAGNOSIS — G20 Parkinson's disease: Secondary | ICD-10-CM | POA: Diagnosis not present

## 2018-12-06 DIAGNOSIS — G40909 Epilepsy, unspecified, not intractable, without status epilepticus: Secondary | ICD-10-CM | POA: Diagnosis not present

## 2018-12-06 DIAGNOSIS — R634 Abnormal weight loss: Secondary | ICD-10-CM | POA: Diagnosis not present

## 2018-12-06 DIAGNOSIS — R339 Retention of urine, unspecified: Secondary | ICD-10-CM | POA: Diagnosis not present

## 2018-12-06 DIAGNOSIS — R1310 Dysphagia, unspecified: Secondary | ICD-10-CM | POA: Diagnosis not present

## 2018-12-06 DIAGNOSIS — N401 Enlarged prostate with lower urinary tract symptoms: Secondary | ICD-10-CM | POA: Diagnosis not present

## 2018-12-10 DIAGNOSIS — R339 Retention of urine, unspecified: Secondary | ICD-10-CM | POA: Diagnosis not present

## 2018-12-10 DIAGNOSIS — N401 Enlarged prostate with lower urinary tract symptoms: Secondary | ICD-10-CM | POA: Diagnosis not present

## 2018-12-10 DIAGNOSIS — G20 Parkinson's disease: Secondary | ICD-10-CM | POA: Diagnosis not present

## 2018-12-10 DIAGNOSIS — R1310 Dysphagia, unspecified: Secondary | ICD-10-CM | POA: Diagnosis not present

## 2018-12-10 DIAGNOSIS — R634 Abnormal weight loss: Secondary | ICD-10-CM | POA: Diagnosis not present

## 2018-12-10 DIAGNOSIS — G40909 Epilepsy, unspecified, not intractable, without status epilepticus: Secondary | ICD-10-CM | POA: Diagnosis not present

## 2018-12-11 DIAGNOSIS — G40909 Epilepsy, unspecified, not intractable, without status epilepticus: Secondary | ICD-10-CM | POA: Diagnosis not present

## 2018-12-11 DIAGNOSIS — G20 Parkinson's disease: Secondary | ICD-10-CM | POA: Diagnosis not present

## 2018-12-11 DIAGNOSIS — R339 Retention of urine, unspecified: Secondary | ICD-10-CM | POA: Diagnosis not present

## 2018-12-11 DIAGNOSIS — R1310 Dysphagia, unspecified: Secondary | ICD-10-CM | POA: Diagnosis not present

## 2018-12-11 DIAGNOSIS — N401 Enlarged prostate with lower urinary tract symptoms: Secondary | ICD-10-CM | POA: Diagnosis not present

## 2018-12-11 DIAGNOSIS — R634 Abnormal weight loss: Secondary | ICD-10-CM | POA: Diagnosis not present

## 2018-12-12 ENCOUNTER — Ambulatory Visit: Payer: Medicare Other | Admitting: Family Medicine

## 2018-12-13 DIAGNOSIS — N401 Enlarged prostate with lower urinary tract symptoms: Secondary | ICD-10-CM | POA: Diagnosis not present

## 2018-12-13 DIAGNOSIS — R1310 Dysphagia, unspecified: Secondary | ICD-10-CM | POA: Diagnosis not present

## 2018-12-13 DIAGNOSIS — G20 Parkinson's disease: Secondary | ICD-10-CM | POA: Diagnosis not present

## 2018-12-13 DIAGNOSIS — R339 Retention of urine, unspecified: Secondary | ICD-10-CM | POA: Diagnosis not present

## 2018-12-13 DIAGNOSIS — R634 Abnormal weight loss: Secondary | ICD-10-CM | POA: Diagnosis not present

## 2018-12-13 DIAGNOSIS — G40909 Epilepsy, unspecified, not intractable, without status epilepticus: Secondary | ICD-10-CM | POA: Diagnosis not present

## 2019-01-02 ENCOUNTER — Ambulatory Visit: Payer: Medicare Other | Admitting: Neurology

## 2019-01-15 IMAGING — CT CT ANGIO CHEST
2 of 8 series · 18 of 36 positions shown · IV contrast (iopamidol)
Comparison: None.

CLINICAL DATA: Chest pain increasing shortness of breath.
Parkinson's and dementia. Difficulty following commands.

EXAM:
CT ANGIOGRAPHY CHEST WITH CONTRAST
TECHNIQUE: Multidetector CT imaging of the chest was performed using the
standard protocol during bolus administration of intravenous
contrast. Multiplanar CT image reconstructions and MIPs were
obtained to evaluate the vascular anatomy.
CONTRAST:  100mL JCELG8-AP9 IOPAMIDOL (JCELG8-AP9) INJECTION 76%

[Series 7: pe thins · axial · 0.67mm/px · z∈[-442,-149]mm · 17 of 329 slices shown]
[im 18/329  lung]
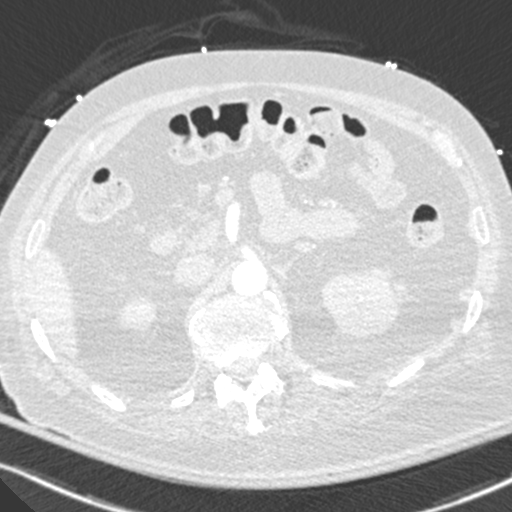
[im 35/329  mediastinal]
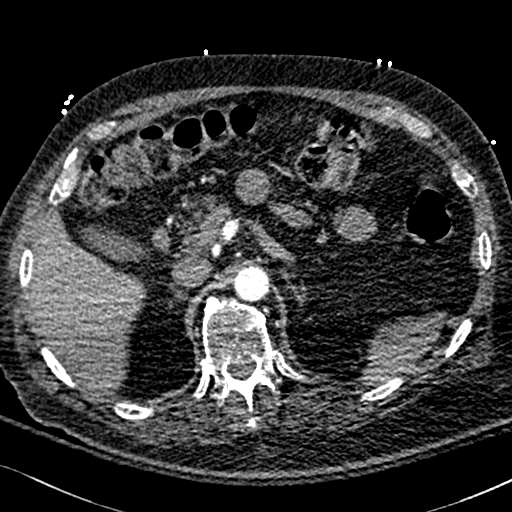
[im 52/329  lung]
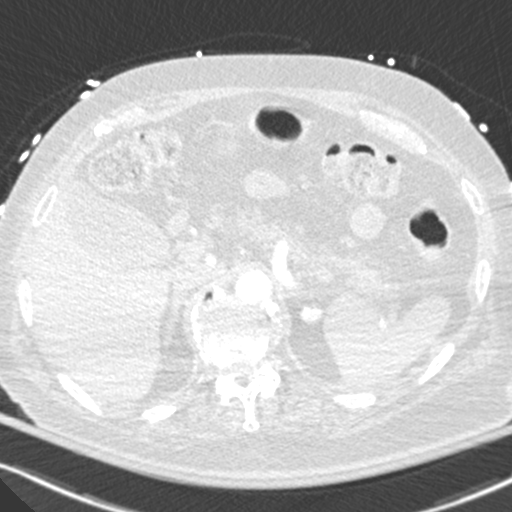
[im 70/329  mediastinal]
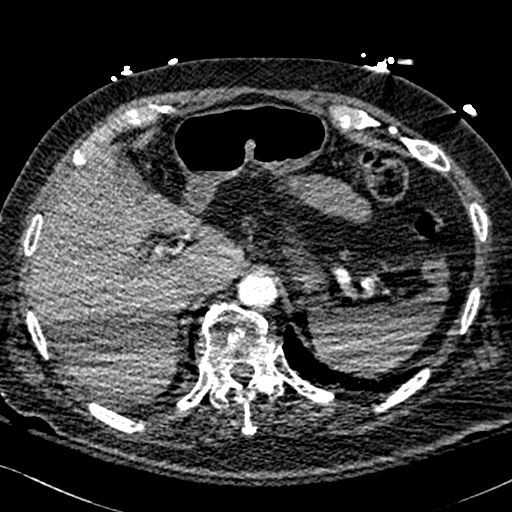
[im 87/329  lung]
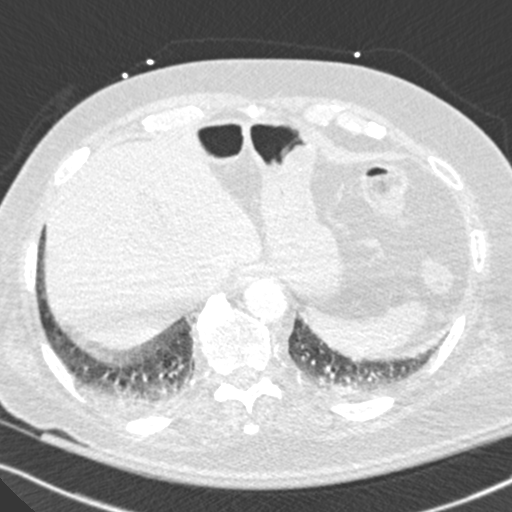
[im 104/329  mediastinal]
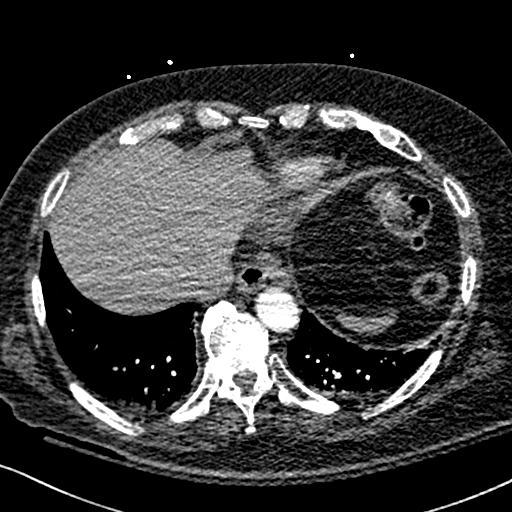
[im 121/329  lung]
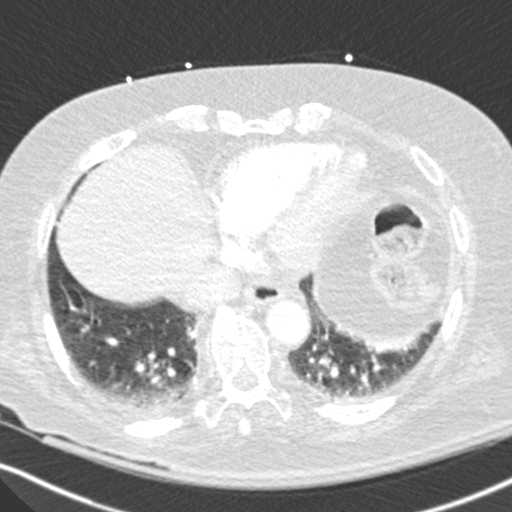
[im 139/329  mediastinal]
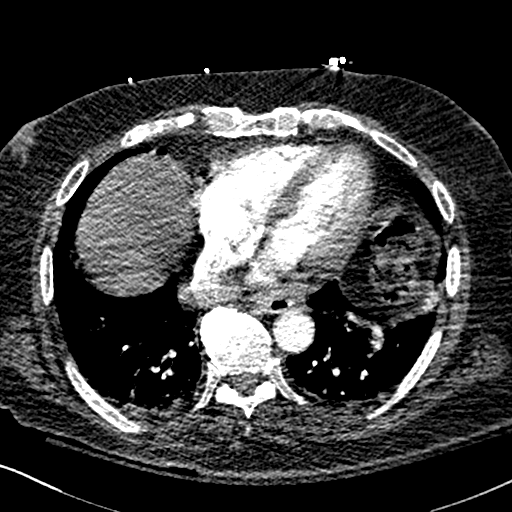
[im 173/329  lung]
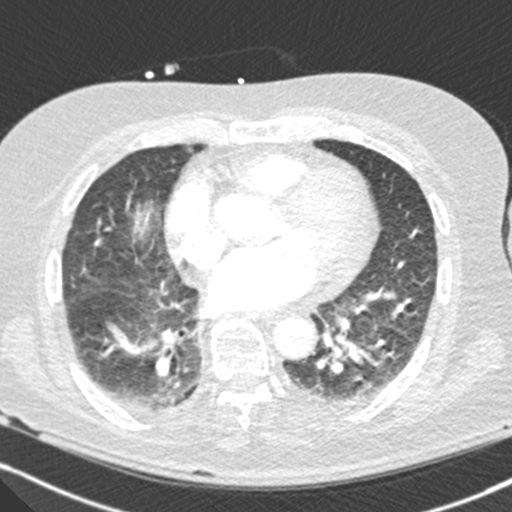
[im 190/329  mediastinal]
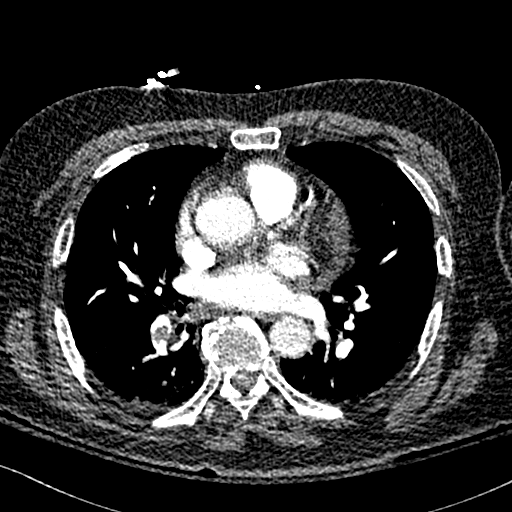
[im 208/329  lung]
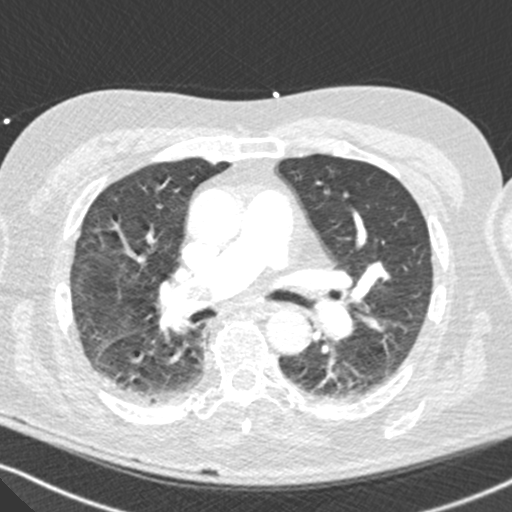
[im 225/329  mediastinal]
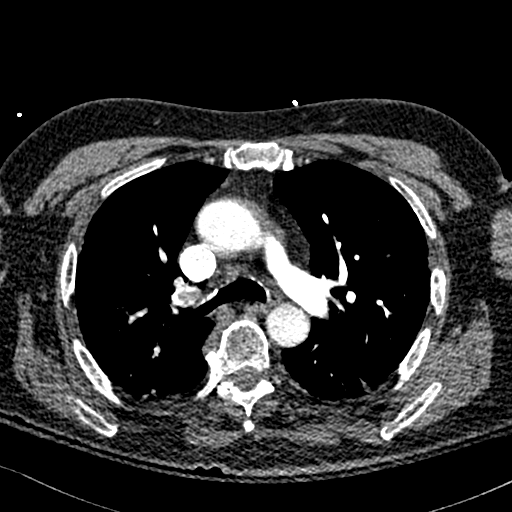
[im 242/329  lung]
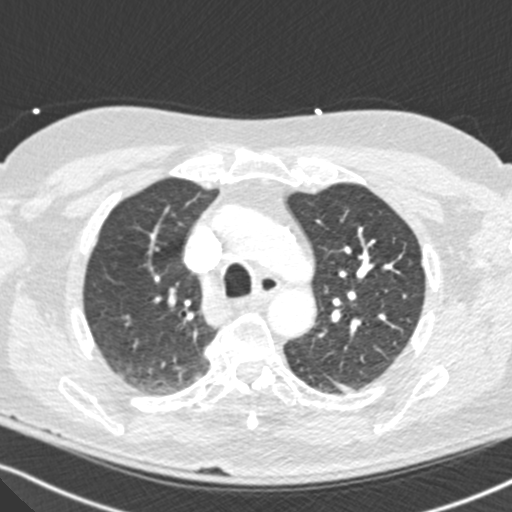
[im 259/329  mediastinal]
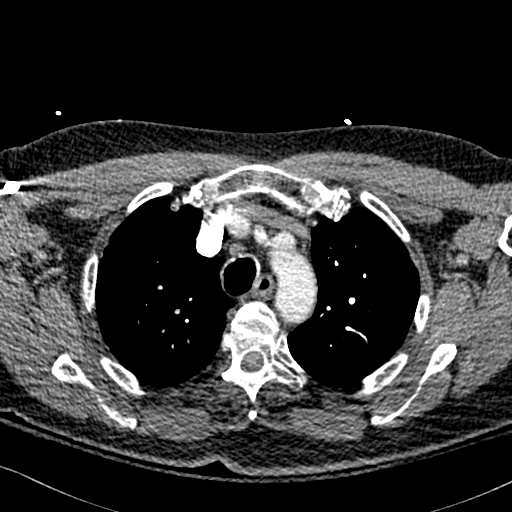
[im 277/329  lung]
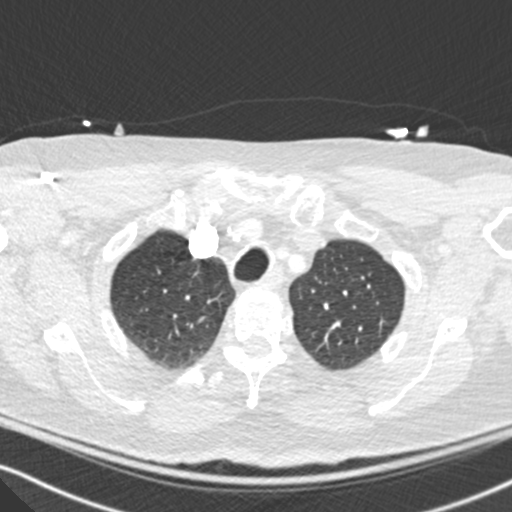
[im 294/329  mediastinal]
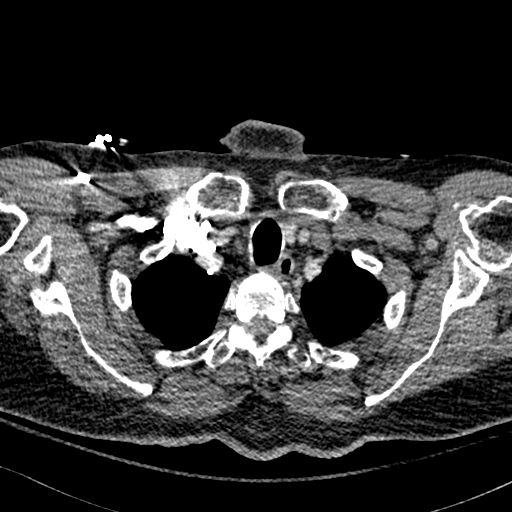
[im 311/329  lung]
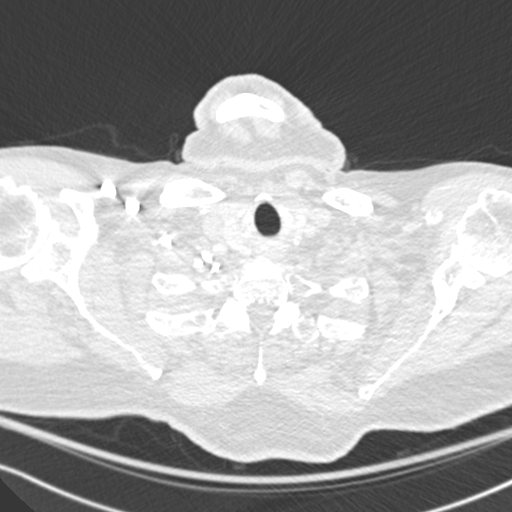

[Series 8: pe coronal mpr · coronal · 0.66mm/px · 1 of 143 slices shown]
[im 72/143  mediastinal]
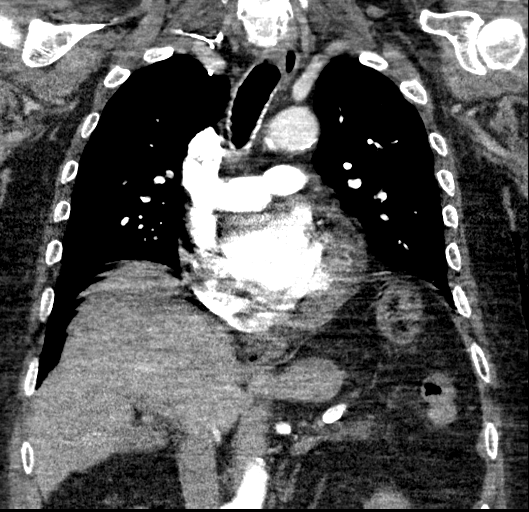

[18 of 36 positions shown; findings below may reference images not displayed]

FINDINGS: Cardiovascular: Large filling defect in the distal right main
pulmonary artery extending into several upper and lower lobe
branches consistent with acute pulmonary embolus. RV to LV ratio is
0.81. This suggests right heart strain to be unlikely. Normal heart
size. Small pericardial effusion. Normal caliber thoracic aorta.
Calcification of the aorta and coronary artery's. Great vessel
origins are patent.

Mediastinum/Nodes: No enlarged mediastinal, hilar, or axillary lymph
nodes. Thyroid gland, trachea, and esophagus demonstrate no
significant findings.

Lungs/Pleura: Evaluation is limited due to motion artifact
throughout. There is evidence of atelectasis in the lung bases. No
definite consolidation or edema. No pleural effusions. No
pneumothorax.

Upper Abdomen: No acute process demonstrated in the upper abdominal
contents visualized. Fatty infiltration of the pancreas. Vascular
calcifications.

Musculoskeletal: Degenerative changes throughout the spine. No
destructive bone lesions.

Review of the MIP images confirms the above findings.
IMPRESSION: 1. Positive examination for large right main and multiple segmental
right pulmonary artery emboli. No evidence of right heart strain.
2. Small pericardial effusion.
3. Atelectasis in the lung bases.
These results were called by telephone at the time of interpretation
on 01/23/2017 at [DATE] to Dr. IRENA ASBURY , who verbally
acknowledged these results.

Aortic Atherosclerosis ([id number]).

## 2019-01-20 DIAGNOSIS — G40909 Epilepsy, unspecified, not intractable, without status epilepticus: Secondary | ICD-10-CM | POA: Diagnosis not present

## 2019-01-20 DIAGNOSIS — I10 Essential (primary) hypertension: Secondary | ICD-10-CM | POA: Diagnosis not present

## 2019-01-20 DIAGNOSIS — G20 Parkinson's disease: Secondary | ICD-10-CM | POA: Diagnosis not present

## 2019-01-20 DIAGNOSIS — R634 Abnormal weight loss: Secondary | ICD-10-CM | POA: Diagnosis not present

## 2019-01-20 DIAGNOSIS — R1310 Dysphagia, unspecified: Secondary | ICD-10-CM | POA: Diagnosis not present

## 2019-01-20 DIAGNOSIS — E785 Hyperlipidemia, unspecified: Secondary | ICD-10-CM | POA: Diagnosis not present

## 2019-01-20 DIAGNOSIS — R339 Retention of urine, unspecified: Secondary | ICD-10-CM | POA: Diagnosis not present

## 2019-01-20 DIAGNOSIS — Z436 Encounter for attention to other artificial openings of urinary tract: Secondary | ICD-10-CM | POA: Diagnosis not present

## 2019-01-20 DIAGNOSIS — N401 Enlarged prostate with lower urinary tract symptoms: Secondary | ICD-10-CM | POA: Diagnosis not present

## 2019-01-24 DIAGNOSIS — R339 Retention of urine, unspecified: Secondary | ICD-10-CM | POA: Diagnosis not present

## 2019-01-24 DIAGNOSIS — G40909 Epilepsy, unspecified, not intractable, without status epilepticus: Secondary | ICD-10-CM | POA: Diagnosis not present

## 2019-01-24 DIAGNOSIS — N401 Enlarged prostate with lower urinary tract symptoms: Secondary | ICD-10-CM | POA: Diagnosis not present

## 2019-01-24 DIAGNOSIS — R634 Abnormal weight loss: Secondary | ICD-10-CM | POA: Diagnosis not present

## 2019-01-24 DIAGNOSIS — R1310 Dysphagia, unspecified: Secondary | ICD-10-CM | POA: Diagnosis not present

## 2019-01-24 DIAGNOSIS — G20 Parkinson's disease: Secondary | ICD-10-CM | POA: Diagnosis not present

## 2019-01-26 DIAGNOSIS — R339 Retention of urine, unspecified: Secondary | ICD-10-CM | POA: Diagnosis not present

## 2019-01-26 DIAGNOSIS — N401 Enlarged prostate with lower urinary tract symptoms: Secondary | ICD-10-CM | POA: Diagnosis not present

## 2019-01-26 DIAGNOSIS — G40909 Epilepsy, unspecified, not intractable, without status epilepticus: Secondary | ICD-10-CM | POA: Diagnosis not present

## 2019-01-26 DIAGNOSIS — G20 Parkinson's disease: Secondary | ICD-10-CM | POA: Diagnosis not present

## 2019-01-26 DIAGNOSIS — R634 Abnormal weight loss: Secondary | ICD-10-CM | POA: Diagnosis not present

## 2019-01-26 DIAGNOSIS — R1310 Dysphagia, unspecified: Secondary | ICD-10-CM | POA: Diagnosis not present

## 2019-02-05 DIAGNOSIS — R1310 Dysphagia, unspecified: Secondary | ICD-10-CM | POA: Diagnosis not present

## 2019-02-05 DIAGNOSIS — R634 Abnormal weight loss: Secondary | ICD-10-CM | POA: Diagnosis not present

## 2019-02-05 DIAGNOSIS — G40909 Epilepsy, unspecified, not intractable, without status epilepticus: Secondary | ICD-10-CM | POA: Diagnosis not present

## 2019-02-05 DIAGNOSIS — R339 Retention of urine, unspecified: Secondary | ICD-10-CM | POA: Diagnosis not present

## 2019-02-05 DIAGNOSIS — N401 Enlarged prostate with lower urinary tract symptoms: Secondary | ICD-10-CM | POA: Diagnosis not present

## 2019-02-05 DIAGNOSIS — G20 Parkinson's disease: Secondary | ICD-10-CM | POA: Diagnosis not present

## 2019-02-10 DIAGNOSIS — R1310 Dysphagia, unspecified: Secondary | ICD-10-CM | POA: Diagnosis not present

## 2019-02-10 DIAGNOSIS — G40909 Epilepsy, unspecified, not intractable, without status epilepticus: Secondary | ICD-10-CM | POA: Diagnosis not present

## 2019-02-10 DIAGNOSIS — N401 Enlarged prostate with lower urinary tract symptoms: Secondary | ICD-10-CM | POA: Diagnosis not present

## 2019-02-10 DIAGNOSIS — G20 Parkinson's disease: Secondary | ICD-10-CM | POA: Diagnosis not present

## 2019-02-10 DIAGNOSIS — R634 Abnormal weight loss: Secondary | ICD-10-CM | POA: Diagnosis not present

## 2019-02-10 DIAGNOSIS — R339 Retention of urine, unspecified: Secondary | ICD-10-CM | POA: Diagnosis not present

## 2019-02-20 DIAGNOSIS — R339 Retention of urine, unspecified: Secondary | ICD-10-CM | POA: Diagnosis not present

## 2019-02-20 DIAGNOSIS — G40909 Epilepsy, unspecified, not intractable, without status epilepticus: Secondary | ICD-10-CM | POA: Diagnosis not present

## 2019-02-20 DIAGNOSIS — E785 Hyperlipidemia, unspecified: Secondary | ICD-10-CM | POA: Diagnosis not present

## 2019-02-20 DIAGNOSIS — R1310 Dysphagia, unspecified: Secondary | ICD-10-CM | POA: Diagnosis not present

## 2019-02-20 DIAGNOSIS — I10 Essential (primary) hypertension: Secondary | ICD-10-CM | POA: Diagnosis not present

## 2019-02-20 DIAGNOSIS — N401 Enlarged prostate with lower urinary tract symptoms: Secondary | ICD-10-CM | POA: Diagnosis not present

## 2019-02-20 DIAGNOSIS — G20 Parkinson's disease: Secondary | ICD-10-CM | POA: Diagnosis not present

## 2019-02-20 DIAGNOSIS — Z436 Encounter for attention to other artificial openings of urinary tract: Secondary | ICD-10-CM | POA: Diagnosis not present

## 2019-02-20 DIAGNOSIS — R634 Abnormal weight loss: Secondary | ICD-10-CM | POA: Diagnosis not present

## 2019-02-21 DIAGNOSIS — R1310 Dysphagia, unspecified: Secondary | ICD-10-CM | POA: Diagnosis not present

## 2019-02-21 DIAGNOSIS — G20 Parkinson's disease: Secondary | ICD-10-CM | POA: Diagnosis not present

## 2019-02-21 DIAGNOSIS — R634 Abnormal weight loss: Secondary | ICD-10-CM | POA: Diagnosis not present

## 2019-02-21 DIAGNOSIS — R339 Retention of urine, unspecified: Secondary | ICD-10-CM | POA: Diagnosis not present

## 2019-02-21 DIAGNOSIS — G40909 Epilepsy, unspecified, not intractable, without status epilepticus: Secondary | ICD-10-CM | POA: Diagnosis not present

## 2019-02-21 DIAGNOSIS — N401 Enlarged prostate with lower urinary tract symptoms: Secondary | ICD-10-CM | POA: Diagnosis not present

## 2019-02-27 DIAGNOSIS — R1310 Dysphagia, unspecified: Secondary | ICD-10-CM | POA: Diagnosis not present

## 2019-02-27 DIAGNOSIS — R339 Retention of urine, unspecified: Secondary | ICD-10-CM | POA: Diagnosis not present

## 2019-02-27 DIAGNOSIS — G20 Parkinson's disease: Secondary | ICD-10-CM | POA: Diagnosis not present

## 2019-02-27 DIAGNOSIS — R634 Abnormal weight loss: Secondary | ICD-10-CM | POA: Diagnosis not present

## 2019-02-27 DIAGNOSIS — N401 Enlarged prostate with lower urinary tract symptoms: Secondary | ICD-10-CM | POA: Diagnosis not present

## 2019-02-27 DIAGNOSIS — G40909 Epilepsy, unspecified, not intractable, without status epilepticus: Secondary | ICD-10-CM | POA: Diagnosis not present

## 2019-03-04 DIAGNOSIS — G40909 Epilepsy, unspecified, not intractable, without status epilepticus: Secondary | ICD-10-CM | POA: Diagnosis not present

## 2019-03-04 DIAGNOSIS — N401 Enlarged prostate with lower urinary tract symptoms: Secondary | ICD-10-CM | POA: Diagnosis not present

## 2019-03-04 DIAGNOSIS — R634 Abnormal weight loss: Secondary | ICD-10-CM | POA: Diagnosis not present

## 2019-03-04 DIAGNOSIS — R339 Retention of urine, unspecified: Secondary | ICD-10-CM | POA: Diagnosis not present

## 2019-03-04 DIAGNOSIS — G20 Parkinson's disease: Secondary | ICD-10-CM | POA: Diagnosis not present

## 2019-03-04 DIAGNOSIS — R1310 Dysphagia, unspecified: Secondary | ICD-10-CM | POA: Diagnosis not present

## 2019-03-07 DIAGNOSIS — R634 Abnormal weight loss: Secondary | ICD-10-CM | POA: Diagnosis not present

## 2019-03-07 DIAGNOSIS — R1310 Dysphagia, unspecified: Secondary | ICD-10-CM | POA: Diagnosis not present

## 2019-03-07 DIAGNOSIS — R339 Retention of urine, unspecified: Secondary | ICD-10-CM | POA: Diagnosis not present

## 2019-03-07 DIAGNOSIS — G40909 Epilepsy, unspecified, not intractable, without status epilepticus: Secondary | ICD-10-CM | POA: Diagnosis not present

## 2019-03-07 DIAGNOSIS — N401 Enlarged prostate with lower urinary tract symptoms: Secondary | ICD-10-CM | POA: Diagnosis not present

## 2019-03-07 DIAGNOSIS — G20 Parkinson's disease: Secondary | ICD-10-CM | POA: Diagnosis not present

## 2019-03-08 DIAGNOSIS — R634 Abnormal weight loss: Secondary | ICD-10-CM | POA: Diagnosis not present

## 2019-03-08 DIAGNOSIS — R339 Retention of urine, unspecified: Secondary | ICD-10-CM | POA: Diagnosis not present

## 2019-03-08 DIAGNOSIS — G40909 Epilepsy, unspecified, not intractable, without status epilepticus: Secondary | ICD-10-CM | POA: Diagnosis not present

## 2019-03-08 DIAGNOSIS — G20 Parkinson's disease: Secondary | ICD-10-CM | POA: Diagnosis not present

## 2019-03-08 DIAGNOSIS — N401 Enlarged prostate with lower urinary tract symptoms: Secondary | ICD-10-CM | POA: Diagnosis not present

## 2019-03-08 DIAGNOSIS — R1310 Dysphagia, unspecified: Secondary | ICD-10-CM | POA: Diagnosis not present

## 2019-03-14 DIAGNOSIS — G20 Parkinson's disease: Secondary | ICD-10-CM | POA: Diagnosis not present

## 2019-03-14 DIAGNOSIS — R634 Abnormal weight loss: Secondary | ICD-10-CM | POA: Diagnosis not present

## 2019-03-14 DIAGNOSIS — R1310 Dysphagia, unspecified: Secondary | ICD-10-CM | POA: Diagnosis not present

## 2019-03-14 DIAGNOSIS — R339 Retention of urine, unspecified: Secondary | ICD-10-CM | POA: Diagnosis not present

## 2019-03-14 DIAGNOSIS — G40909 Epilepsy, unspecified, not intractable, without status epilepticus: Secondary | ICD-10-CM | POA: Diagnosis not present

## 2019-03-14 DIAGNOSIS — N401 Enlarged prostate with lower urinary tract symptoms: Secondary | ICD-10-CM | POA: Diagnosis not present

## 2019-03-20 DIAGNOSIS — R634 Abnormal weight loss: Secondary | ICD-10-CM | POA: Diagnosis not present

## 2019-03-20 DIAGNOSIS — E785 Hyperlipidemia, unspecified: Secondary | ICD-10-CM | POA: Diagnosis not present

## 2019-03-20 DIAGNOSIS — N401 Enlarged prostate with lower urinary tract symptoms: Secondary | ICD-10-CM | POA: Diagnosis not present

## 2019-03-20 DIAGNOSIS — G40909 Epilepsy, unspecified, not intractable, without status epilepticus: Secondary | ICD-10-CM | POA: Diagnosis not present

## 2019-03-20 DIAGNOSIS — R1310 Dysphagia, unspecified: Secondary | ICD-10-CM | POA: Diagnosis not present

## 2019-03-20 DIAGNOSIS — I10 Essential (primary) hypertension: Secondary | ICD-10-CM | POA: Diagnosis not present

## 2019-03-20 DIAGNOSIS — Z436 Encounter for attention to other artificial openings of urinary tract: Secondary | ICD-10-CM | POA: Diagnosis not present

## 2019-03-20 DIAGNOSIS — G20 Parkinson's disease: Secondary | ICD-10-CM | POA: Diagnosis not present

## 2019-03-20 DIAGNOSIS — R339 Retention of urine, unspecified: Secondary | ICD-10-CM | POA: Diagnosis not present

## 2019-03-21 DIAGNOSIS — R1310 Dysphagia, unspecified: Secondary | ICD-10-CM | POA: Diagnosis not present

## 2019-03-21 DIAGNOSIS — N401 Enlarged prostate with lower urinary tract symptoms: Secondary | ICD-10-CM | POA: Diagnosis not present

## 2019-03-21 DIAGNOSIS — G20 Parkinson's disease: Secondary | ICD-10-CM | POA: Diagnosis not present

## 2019-03-21 DIAGNOSIS — R339 Retention of urine, unspecified: Secondary | ICD-10-CM | POA: Diagnosis not present

## 2019-03-21 DIAGNOSIS — R634 Abnormal weight loss: Secondary | ICD-10-CM | POA: Diagnosis not present

## 2019-03-21 DIAGNOSIS — G40909 Epilepsy, unspecified, not intractable, without status epilepticus: Secondary | ICD-10-CM | POA: Diagnosis not present

## 2019-03-31 DIAGNOSIS — R339 Retention of urine, unspecified: Secondary | ICD-10-CM | POA: Diagnosis not present

## 2019-03-31 DIAGNOSIS — R1310 Dysphagia, unspecified: Secondary | ICD-10-CM | POA: Diagnosis not present

## 2019-03-31 DIAGNOSIS — G20 Parkinson's disease: Secondary | ICD-10-CM | POA: Diagnosis not present

## 2019-03-31 DIAGNOSIS — G40909 Epilepsy, unspecified, not intractable, without status epilepticus: Secondary | ICD-10-CM | POA: Diagnosis not present

## 2019-03-31 DIAGNOSIS — N401 Enlarged prostate with lower urinary tract symptoms: Secondary | ICD-10-CM | POA: Diagnosis not present

## 2019-03-31 DIAGNOSIS — R634 Abnormal weight loss: Secondary | ICD-10-CM | POA: Diagnosis not present

## 2019-04-04 DIAGNOSIS — R1310 Dysphagia, unspecified: Secondary | ICD-10-CM | POA: Diagnosis not present

## 2019-04-04 DIAGNOSIS — G40909 Epilepsy, unspecified, not intractable, without status epilepticus: Secondary | ICD-10-CM | POA: Diagnosis not present

## 2019-04-04 DIAGNOSIS — G20 Parkinson's disease: Secondary | ICD-10-CM | POA: Diagnosis not present

## 2019-04-04 DIAGNOSIS — R634 Abnormal weight loss: Secondary | ICD-10-CM | POA: Diagnosis not present

## 2019-04-04 DIAGNOSIS — N401 Enlarged prostate with lower urinary tract symptoms: Secondary | ICD-10-CM | POA: Diagnosis not present

## 2019-04-04 DIAGNOSIS — R339 Retention of urine, unspecified: Secondary | ICD-10-CM | POA: Diagnosis not present

## 2019-04-05 DIAGNOSIS — R1310 Dysphagia, unspecified: Secondary | ICD-10-CM | POA: Diagnosis not present

## 2019-04-05 DIAGNOSIS — R339 Retention of urine, unspecified: Secondary | ICD-10-CM | POA: Diagnosis not present

## 2019-04-05 DIAGNOSIS — R634 Abnormal weight loss: Secondary | ICD-10-CM | POA: Diagnosis not present

## 2019-04-05 DIAGNOSIS — N401 Enlarged prostate with lower urinary tract symptoms: Secondary | ICD-10-CM | POA: Diagnosis not present

## 2019-04-05 DIAGNOSIS — G20 Parkinson's disease: Secondary | ICD-10-CM | POA: Diagnosis not present

## 2019-04-05 DIAGNOSIS — G40909 Epilepsy, unspecified, not intractable, without status epilepticus: Secondary | ICD-10-CM | POA: Diagnosis not present

## 2019-04-06 DIAGNOSIS — G20 Parkinson's disease: Secondary | ICD-10-CM | POA: Diagnosis not present

## 2019-04-06 DIAGNOSIS — R339 Retention of urine, unspecified: Secondary | ICD-10-CM | POA: Diagnosis not present

## 2019-04-06 DIAGNOSIS — N401 Enlarged prostate with lower urinary tract symptoms: Secondary | ICD-10-CM | POA: Diagnosis not present

## 2019-04-06 DIAGNOSIS — G40909 Epilepsy, unspecified, not intractable, without status epilepticus: Secondary | ICD-10-CM | POA: Diagnosis not present

## 2019-04-06 DIAGNOSIS — R634 Abnormal weight loss: Secondary | ICD-10-CM | POA: Diagnosis not present

## 2019-04-06 DIAGNOSIS — R1310 Dysphagia, unspecified: Secondary | ICD-10-CM | POA: Diagnosis not present

## 2019-04-12 DIAGNOSIS — G40909 Epilepsy, unspecified, not intractable, without status epilepticus: Secondary | ICD-10-CM | POA: Diagnosis not present

## 2019-04-12 DIAGNOSIS — R634 Abnormal weight loss: Secondary | ICD-10-CM | POA: Diagnosis not present

## 2019-04-12 DIAGNOSIS — R339 Retention of urine, unspecified: Secondary | ICD-10-CM | POA: Diagnosis not present

## 2019-04-12 DIAGNOSIS — R1310 Dysphagia, unspecified: Secondary | ICD-10-CM | POA: Diagnosis not present

## 2019-04-12 DIAGNOSIS — N401 Enlarged prostate with lower urinary tract symptoms: Secondary | ICD-10-CM | POA: Diagnosis not present

## 2019-04-12 DIAGNOSIS — G20 Parkinson's disease: Secondary | ICD-10-CM | POA: Diagnosis not present

## 2019-04-19 DIAGNOSIS — G40909 Epilepsy, unspecified, not intractable, without status epilepticus: Secondary | ICD-10-CM | POA: Diagnosis not present

## 2019-04-19 DIAGNOSIS — N401 Enlarged prostate with lower urinary tract symptoms: Secondary | ICD-10-CM | POA: Diagnosis not present

## 2019-04-19 DIAGNOSIS — R634 Abnormal weight loss: Secondary | ICD-10-CM | POA: Diagnosis not present

## 2019-04-19 DIAGNOSIS — G20 Parkinson's disease: Secondary | ICD-10-CM | POA: Diagnosis not present

## 2019-04-19 DIAGNOSIS — R339 Retention of urine, unspecified: Secondary | ICD-10-CM | POA: Diagnosis not present

## 2019-04-19 DIAGNOSIS — R1310 Dysphagia, unspecified: Secondary | ICD-10-CM | POA: Diagnosis not present

## 2019-04-20 DIAGNOSIS — G20 Parkinson's disease: Secondary | ICD-10-CM | POA: Diagnosis not present

## 2019-04-20 DIAGNOSIS — R634 Abnormal weight loss: Secondary | ICD-10-CM | POA: Diagnosis not present

## 2019-04-20 DIAGNOSIS — R1310 Dysphagia, unspecified: Secondary | ICD-10-CM | POA: Diagnosis not present

## 2019-04-20 DIAGNOSIS — N401 Enlarged prostate with lower urinary tract symptoms: Secondary | ICD-10-CM | POA: Diagnosis not present

## 2019-04-20 DIAGNOSIS — G40909 Epilepsy, unspecified, not intractable, without status epilepticus: Secondary | ICD-10-CM | POA: Diagnosis not present

## 2019-04-20 DIAGNOSIS — I10 Essential (primary) hypertension: Secondary | ICD-10-CM | POA: Diagnosis not present

## 2019-04-20 DIAGNOSIS — R339 Retention of urine, unspecified: Secondary | ICD-10-CM | POA: Diagnosis not present

## 2019-04-20 DIAGNOSIS — E785 Hyperlipidemia, unspecified: Secondary | ICD-10-CM | POA: Diagnosis not present

## 2019-04-20 DIAGNOSIS — Z436 Encounter for attention to other artificial openings of urinary tract: Secondary | ICD-10-CM | POA: Diagnosis not present

## 2019-04-21 DIAGNOSIS — N401 Enlarged prostate with lower urinary tract symptoms: Secondary | ICD-10-CM | POA: Diagnosis not present

## 2019-04-21 DIAGNOSIS — R634 Abnormal weight loss: Secondary | ICD-10-CM | POA: Diagnosis not present

## 2019-04-21 DIAGNOSIS — G20 Parkinson's disease: Secondary | ICD-10-CM | POA: Diagnosis not present

## 2019-04-21 DIAGNOSIS — R339 Retention of urine, unspecified: Secondary | ICD-10-CM | POA: Diagnosis not present

## 2019-04-21 DIAGNOSIS — R1310 Dysphagia, unspecified: Secondary | ICD-10-CM | POA: Diagnosis not present

## 2019-04-21 DIAGNOSIS — G40909 Epilepsy, unspecified, not intractable, without status epilepticus: Secondary | ICD-10-CM | POA: Diagnosis not present

## 2019-04-26 DIAGNOSIS — G40909 Epilepsy, unspecified, not intractable, without status epilepticus: Secondary | ICD-10-CM | POA: Diagnosis not present

## 2019-04-26 DIAGNOSIS — R339 Retention of urine, unspecified: Secondary | ICD-10-CM | POA: Diagnosis not present

## 2019-04-26 DIAGNOSIS — R1310 Dysphagia, unspecified: Secondary | ICD-10-CM | POA: Diagnosis not present

## 2019-04-26 DIAGNOSIS — R634 Abnormal weight loss: Secondary | ICD-10-CM | POA: Diagnosis not present

## 2019-04-26 DIAGNOSIS — G20 Parkinson's disease: Secondary | ICD-10-CM | POA: Diagnosis not present

## 2019-04-26 DIAGNOSIS — N401 Enlarged prostate with lower urinary tract symptoms: Secondary | ICD-10-CM | POA: Diagnosis not present

## 2019-04-29 DIAGNOSIS — R634 Abnormal weight loss: Secondary | ICD-10-CM | POA: Diagnosis not present

## 2019-04-29 DIAGNOSIS — N401 Enlarged prostate with lower urinary tract symptoms: Secondary | ICD-10-CM | POA: Diagnosis not present

## 2019-04-29 DIAGNOSIS — R1310 Dysphagia, unspecified: Secondary | ICD-10-CM | POA: Diagnosis not present

## 2019-04-29 DIAGNOSIS — G20 Parkinson's disease: Secondary | ICD-10-CM | POA: Diagnosis not present

## 2019-04-29 DIAGNOSIS — G40909 Epilepsy, unspecified, not intractable, without status epilepticus: Secondary | ICD-10-CM | POA: Diagnosis not present

## 2019-04-29 DIAGNOSIS — R339 Retention of urine, unspecified: Secondary | ICD-10-CM | POA: Diagnosis not present

## 2019-05-03 DIAGNOSIS — R634 Abnormal weight loss: Secondary | ICD-10-CM | POA: Diagnosis not present

## 2019-05-03 DIAGNOSIS — R1310 Dysphagia, unspecified: Secondary | ICD-10-CM | POA: Diagnosis not present

## 2019-05-03 DIAGNOSIS — N401 Enlarged prostate with lower urinary tract symptoms: Secondary | ICD-10-CM | POA: Diagnosis not present

## 2019-05-03 DIAGNOSIS — R339 Retention of urine, unspecified: Secondary | ICD-10-CM | POA: Diagnosis not present

## 2019-05-03 DIAGNOSIS — G20 Parkinson's disease: Secondary | ICD-10-CM | POA: Diagnosis not present

## 2019-05-03 DIAGNOSIS — G40909 Epilepsy, unspecified, not intractable, without status epilepticus: Secondary | ICD-10-CM | POA: Diagnosis not present

## 2019-05-04 DIAGNOSIS — R634 Abnormal weight loss: Secondary | ICD-10-CM | POA: Diagnosis not present

## 2019-05-04 DIAGNOSIS — N401 Enlarged prostate with lower urinary tract symptoms: Secondary | ICD-10-CM | POA: Diagnosis not present

## 2019-05-04 DIAGNOSIS — G40909 Epilepsy, unspecified, not intractable, without status epilepticus: Secondary | ICD-10-CM | POA: Diagnosis not present

## 2019-05-04 DIAGNOSIS — G20 Parkinson's disease: Secondary | ICD-10-CM | POA: Diagnosis not present

## 2019-05-04 DIAGNOSIS — R1310 Dysphagia, unspecified: Secondary | ICD-10-CM | POA: Diagnosis not present

## 2019-05-04 DIAGNOSIS — R339 Retention of urine, unspecified: Secondary | ICD-10-CM | POA: Diagnosis not present

## 2019-05-09 DIAGNOSIS — G40909 Epilepsy, unspecified, not intractable, without status epilepticus: Secondary | ICD-10-CM | POA: Diagnosis not present

## 2019-05-09 DIAGNOSIS — R634 Abnormal weight loss: Secondary | ICD-10-CM | POA: Diagnosis not present

## 2019-05-09 DIAGNOSIS — N401 Enlarged prostate with lower urinary tract symptoms: Secondary | ICD-10-CM | POA: Diagnosis not present

## 2019-05-09 DIAGNOSIS — R339 Retention of urine, unspecified: Secondary | ICD-10-CM | POA: Diagnosis not present

## 2019-05-09 DIAGNOSIS — G20 Parkinson's disease: Secondary | ICD-10-CM | POA: Diagnosis not present

## 2019-05-09 DIAGNOSIS — R1310 Dysphagia, unspecified: Secondary | ICD-10-CM | POA: Diagnosis not present

## 2019-05-17 DIAGNOSIS — G20 Parkinson's disease: Secondary | ICD-10-CM | POA: Diagnosis not present

## 2019-05-17 DIAGNOSIS — R634 Abnormal weight loss: Secondary | ICD-10-CM | POA: Diagnosis not present

## 2019-05-17 DIAGNOSIS — R339 Retention of urine, unspecified: Secondary | ICD-10-CM | POA: Diagnosis not present

## 2019-05-17 DIAGNOSIS — N401 Enlarged prostate with lower urinary tract symptoms: Secondary | ICD-10-CM | POA: Diagnosis not present

## 2019-05-17 DIAGNOSIS — R1310 Dysphagia, unspecified: Secondary | ICD-10-CM | POA: Diagnosis not present

## 2019-05-17 DIAGNOSIS — G40909 Epilepsy, unspecified, not intractable, without status epilepticus: Secondary | ICD-10-CM | POA: Diagnosis not present

## 2019-05-20 DIAGNOSIS — E785 Hyperlipidemia, unspecified: Secondary | ICD-10-CM | POA: Diagnosis not present

## 2019-05-20 DIAGNOSIS — R634 Abnormal weight loss: Secondary | ICD-10-CM | POA: Diagnosis not present

## 2019-05-20 DIAGNOSIS — N401 Enlarged prostate with lower urinary tract symptoms: Secondary | ICD-10-CM | POA: Diagnosis not present

## 2019-05-20 DIAGNOSIS — Z436 Encounter for attention to other artificial openings of urinary tract: Secondary | ICD-10-CM | POA: Diagnosis not present

## 2019-05-20 DIAGNOSIS — R339 Retention of urine, unspecified: Secondary | ICD-10-CM | POA: Diagnosis not present

## 2019-05-20 DIAGNOSIS — R1310 Dysphagia, unspecified: Secondary | ICD-10-CM | POA: Diagnosis not present

## 2019-05-20 DIAGNOSIS — G40909 Epilepsy, unspecified, not intractable, without status epilepticus: Secondary | ICD-10-CM | POA: Diagnosis not present

## 2019-05-20 DIAGNOSIS — I10 Essential (primary) hypertension: Secondary | ICD-10-CM | POA: Diagnosis not present

## 2019-05-20 DIAGNOSIS — G20 Parkinson's disease: Secondary | ICD-10-CM | POA: Diagnosis not present

## 2019-05-22 DIAGNOSIS — G40909 Epilepsy, unspecified, not intractable, without status epilepticus: Secondary | ICD-10-CM | POA: Diagnosis not present

## 2019-05-22 DIAGNOSIS — G20 Parkinson's disease: Secondary | ICD-10-CM | POA: Diagnosis not present

## 2019-05-22 DIAGNOSIS — R339 Retention of urine, unspecified: Secondary | ICD-10-CM | POA: Diagnosis not present

## 2019-05-22 DIAGNOSIS — R634 Abnormal weight loss: Secondary | ICD-10-CM | POA: Diagnosis not present

## 2019-05-22 DIAGNOSIS — N401 Enlarged prostate with lower urinary tract symptoms: Secondary | ICD-10-CM | POA: Diagnosis not present

## 2019-05-22 DIAGNOSIS — R1310 Dysphagia, unspecified: Secondary | ICD-10-CM | POA: Diagnosis not present

## 2019-05-24 DIAGNOSIS — R634 Abnormal weight loss: Secondary | ICD-10-CM | POA: Diagnosis not present

## 2019-05-24 DIAGNOSIS — N401 Enlarged prostate with lower urinary tract symptoms: Secondary | ICD-10-CM | POA: Diagnosis not present

## 2019-05-24 DIAGNOSIS — G40909 Epilepsy, unspecified, not intractable, without status epilepticus: Secondary | ICD-10-CM | POA: Diagnosis not present

## 2019-05-24 DIAGNOSIS — R1310 Dysphagia, unspecified: Secondary | ICD-10-CM | POA: Diagnosis not present

## 2019-05-24 DIAGNOSIS — R339 Retention of urine, unspecified: Secondary | ICD-10-CM | POA: Diagnosis not present

## 2019-05-24 DIAGNOSIS — G20 Parkinson's disease: Secondary | ICD-10-CM | POA: Diagnosis not present

## 2019-05-26 DIAGNOSIS — R339 Retention of urine, unspecified: Secondary | ICD-10-CM | POA: Diagnosis not present

## 2019-05-26 DIAGNOSIS — G40909 Epilepsy, unspecified, not intractable, without status epilepticus: Secondary | ICD-10-CM | POA: Diagnosis not present

## 2019-05-26 DIAGNOSIS — N401 Enlarged prostate with lower urinary tract symptoms: Secondary | ICD-10-CM | POA: Diagnosis not present

## 2019-05-26 DIAGNOSIS — R634 Abnormal weight loss: Secondary | ICD-10-CM | POA: Diagnosis not present

## 2019-05-26 DIAGNOSIS — R1310 Dysphagia, unspecified: Secondary | ICD-10-CM | POA: Diagnosis not present

## 2019-05-26 DIAGNOSIS — G20 Parkinson's disease: Secondary | ICD-10-CM | POA: Diagnosis not present

## 2019-05-31 DIAGNOSIS — R634 Abnormal weight loss: Secondary | ICD-10-CM | POA: Diagnosis not present

## 2019-05-31 DIAGNOSIS — R1310 Dysphagia, unspecified: Secondary | ICD-10-CM | POA: Diagnosis not present

## 2019-05-31 DIAGNOSIS — R339 Retention of urine, unspecified: Secondary | ICD-10-CM | POA: Diagnosis not present

## 2019-05-31 DIAGNOSIS — G20 Parkinson's disease: Secondary | ICD-10-CM | POA: Diagnosis not present

## 2019-05-31 DIAGNOSIS — G40909 Epilepsy, unspecified, not intractable, without status epilepticus: Secondary | ICD-10-CM | POA: Diagnosis not present

## 2019-05-31 DIAGNOSIS — N401 Enlarged prostate with lower urinary tract symptoms: Secondary | ICD-10-CM | POA: Diagnosis not present

## 2019-06-01 DIAGNOSIS — G20 Parkinson's disease: Secondary | ICD-10-CM | POA: Diagnosis not present

## 2019-06-01 DIAGNOSIS — R339 Retention of urine, unspecified: Secondary | ICD-10-CM | POA: Diagnosis not present

## 2019-06-01 DIAGNOSIS — R634 Abnormal weight loss: Secondary | ICD-10-CM | POA: Diagnosis not present

## 2019-06-01 DIAGNOSIS — N401 Enlarged prostate with lower urinary tract symptoms: Secondary | ICD-10-CM | POA: Diagnosis not present

## 2019-06-01 DIAGNOSIS — R1310 Dysphagia, unspecified: Secondary | ICD-10-CM | POA: Diagnosis not present

## 2019-06-01 DIAGNOSIS — G40909 Epilepsy, unspecified, not intractable, without status epilepticus: Secondary | ICD-10-CM | POA: Diagnosis not present

## 2019-06-07 DIAGNOSIS — R339 Retention of urine, unspecified: Secondary | ICD-10-CM | POA: Diagnosis not present

## 2019-06-07 DIAGNOSIS — R1310 Dysphagia, unspecified: Secondary | ICD-10-CM | POA: Diagnosis not present

## 2019-06-07 DIAGNOSIS — N401 Enlarged prostate with lower urinary tract symptoms: Secondary | ICD-10-CM | POA: Diagnosis not present

## 2019-06-07 DIAGNOSIS — G20 Parkinson's disease: Secondary | ICD-10-CM | POA: Diagnosis not present

## 2019-06-07 DIAGNOSIS — R634 Abnormal weight loss: Secondary | ICD-10-CM | POA: Diagnosis not present

## 2019-06-07 DIAGNOSIS — G40909 Epilepsy, unspecified, not intractable, without status epilepticus: Secondary | ICD-10-CM | POA: Diagnosis not present

## 2019-06-12 DIAGNOSIS — R339 Retention of urine, unspecified: Secondary | ICD-10-CM | POA: Diagnosis not present

## 2019-06-12 DIAGNOSIS — G20 Parkinson's disease: Secondary | ICD-10-CM | POA: Diagnosis not present

## 2019-06-12 DIAGNOSIS — R1310 Dysphagia, unspecified: Secondary | ICD-10-CM | POA: Diagnosis not present

## 2019-06-12 DIAGNOSIS — N401 Enlarged prostate with lower urinary tract symptoms: Secondary | ICD-10-CM | POA: Diagnosis not present

## 2019-06-12 DIAGNOSIS — G40909 Epilepsy, unspecified, not intractable, without status epilepticus: Secondary | ICD-10-CM | POA: Diagnosis not present

## 2019-06-12 DIAGNOSIS — R634 Abnormal weight loss: Secondary | ICD-10-CM | POA: Diagnosis not present

## 2019-06-17 DIAGNOSIS — R634 Abnormal weight loss: Secondary | ICD-10-CM | POA: Diagnosis not present

## 2019-06-17 DIAGNOSIS — R1310 Dysphagia, unspecified: Secondary | ICD-10-CM | POA: Diagnosis not present

## 2019-06-17 DIAGNOSIS — G20 Parkinson's disease: Secondary | ICD-10-CM | POA: Diagnosis not present

## 2019-06-17 DIAGNOSIS — N401 Enlarged prostate with lower urinary tract symptoms: Secondary | ICD-10-CM | POA: Diagnosis not present

## 2019-06-17 DIAGNOSIS — G40909 Epilepsy, unspecified, not intractable, without status epilepticus: Secondary | ICD-10-CM | POA: Diagnosis not present

## 2019-06-17 DIAGNOSIS — R339 Retention of urine, unspecified: Secondary | ICD-10-CM | POA: Diagnosis not present

## 2019-07-20 DIAGNOSIS — E785 Hyperlipidemia, unspecified: Secondary | ICD-10-CM | POA: Diagnosis not present

## 2019-07-20 DIAGNOSIS — R339 Retention of urine, unspecified: Secondary | ICD-10-CM | POA: Diagnosis not present

## 2019-07-20 DIAGNOSIS — R634 Abnormal weight loss: Secondary | ICD-10-CM | POA: Diagnosis not present

## 2019-07-20 DIAGNOSIS — R1310 Dysphagia, unspecified: Secondary | ICD-10-CM | POA: Diagnosis not present

## 2019-07-20 DIAGNOSIS — G40909 Epilepsy, unspecified, not intractable, without status epilepticus: Secondary | ICD-10-CM | POA: Diagnosis not present

## 2019-07-20 DIAGNOSIS — G20 Parkinson's disease: Secondary | ICD-10-CM | POA: Diagnosis not present

## 2019-07-20 DIAGNOSIS — N401 Enlarged prostate with lower urinary tract symptoms: Secondary | ICD-10-CM | POA: Diagnosis not present

## 2019-07-20 DIAGNOSIS — I10 Essential (primary) hypertension: Secondary | ICD-10-CM | POA: Diagnosis not present

## 2019-07-20 DIAGNOSIS — Z436 Encounter for attention to other artificial openings of urinary tract: Secondary | ICD-10-CM | POA: Diagnosis not present

## 2019-07-29 DIAGNOSIS — R1310 Dysphagia, unspecified: Secondary | ICD-10-CM | POA: Diagnosis not present

## 2019-07-29 DIAGNOSIS — R634 Abnormal weight loss: Secondary | ICD-10-CM | POA: Diagnosis not present

## 2019-07-29 DIAGNOSIS — G40909 Epilepsy, unspecified, not intractable, without status epilepticus: Secondary | ICD-10-CM | POA: Diagnosis not present

## 2019-07-29 DIAGNOSIS — G20 Parkinson's disease: Secondary | ICD-10-CM | POA: Diagnosis not present

## 2019-07-29 DIAGNOSIS — N401 Enlarged prostate with lower urinary tract symptoms: Secondary | ICD-10-CM | POA: Diagnosis not present

## 2019-07-29 DIAGNOSIS — R339 Retention of urine, unspecified: Secondary | ICD-10-CM | POA: Diagnosis not present

## 2019-07-31 DIAGNOSIS — R339 Retention of urine, unspecified: Secondary | ICD-10-CM | POA: Diagnosis not present

## 2019-07-31 DIAGNOSIS — R1310 Dysphagia, unspecified: Secondary | ICD-10-CM | POA: Diagnosis not present

## 2019-07-31 DIAGNOSIS — R634 Abnormal weight loss: Secondary | ICD-10-CM | POA: Diagnosis not present

## 2019-07-31 DIAGNOSIS — N401 Enlarged prostate with lower urinary tract symptoms: Secondary | ICD-10-CM | POA: Diagnosis not present

## 2019-07-31 DIAGNOSIS — G20 Parkinson's disease: Secondary | ICD-10-CM | POA: Diagnosis not present

## 2019-07-31 DIAGNOSIS — G40909 Epilepsy, unspecified, not intractable, without status epilepticus: Secondary | ICD-10-CM | POA: Diagnosis not present

## 2019-08-01 DIAGNOSIS — R339 Retention of urine, unspecified: Secondary | ICD-10-CM | POA: Diagnosis not present

## 2019-08-01 DIAGNOSIS — R634 Abnormal weight loss: Secondary | ICD-10-CM | POA: Diagnosis not present

## 2019-08-01 DIAGNOSIS — R1310 Dysphagia, unspecified: Secondary | ICD-10-CM | POA: Diagnosis not present

## 2019-08-01 DIAGNOSIS — N401 Enlarged prostate with lower urinary tract symptoms: Secondary | ICD-10-CM | POA: Diagnosis not present

## 2019-08-01 DIAGNOSIS — G20 Parkinson's disease: Secondary | ICD-10-CM | POA: Diagnosis not present

## 2019-08-01 DIAGNOSIS — G40909 Epilepsy, unspecified, not intractable, without status epilepticus: Secondary | ICD-10-CM | POA: Diagnosis not present

## 2019-08-06 DIAGNOSIS — G40909 Epilepsy, unspecified, not intractable, without status epilepticus: Secondary | ICD-10-CM | POA: Diagnosis not present

## 2019-08-06 DIAGNOSIS — R1310 Dysphagia, unspecified: Secondary | ICD-10-CM | POA: Diagnosis not present

## 2019-08-06 DIAGNOSIS — G20 Parkinson's disease: Secondary | ICD-10-CM | POA: Diagnosis not present

## 2019-08-06 DIAGNOSIS — N401 Enlarged prostate with lower urinary tract symptoms: Secondary | ICD-10-CM | POA: Diagnosis not present

## 2019-08-06 DIAGNOSIS — R634 Abnormal weight loss: Secondary | ICD-10-CM | POA: Diagnosis not present

## 2019-08-06 DIAGNOSIS — R339 Retention of urine, unspecified: Secondary | ICD-10-CM | POA: Diagnosis not present

## 2019-08-07 DIAGNOSIS — N401 Enlarged prostate with lower urinary tract symptoms: Secondary | ICD-10-CM | POA: Diagnosis not present

## 2019-08-07 DIAGNOSIS — G40909 Epilepsy, unspecified, not intractable, without status epilepticus: Secondary | ICD-10-CM | POA: Diagnosis not present

## 2019-08-07 DIAGNOSIS — R339 Retention of urine, unspecified: Secondary | ICD-10-CM | POA: Diagnosis not present

## 2019-08-07 DIAGNOSIS — R1310 Dysphagia, unspecified: Secondary | ICD-10-CM | POA: Diagnosis not present

## 2019-08-07 DIAGNOSIS — R634 Abnormal weight loss: Secondary | ICD-10-CM | POA: Diagnosis not present

## 2019-08-07 DIAGNOSIS — G20 Parkinson's disease: Secondary | ICD-10-CM | POA: Diagnosis not present

## 2019-08-20 DIAGNOSIS — G40909 Epilepsy, unspecified, not intractable, without status epilepticus: Secondary | ICD-10-CM | POA: Diagnosis not present

## 2019-08-20 DIAGNOSIS — Z436 Encounter for attention to other artificial openings of urinary tract: Secondary | ICD-10-CM | POA: Diagnosis not present

## 2019-08-20 DIAGNOSIS — R339 Retention of urine, unspecified: Secondary | ICD-10-CM | POA: Diagnosis not present

## 2019-08-20 DIAGNOSIS — G20 Parkinson's disease: Secondary | ICD-10-CM | POA: Diagnosis not present

## 2019-08-20 DIAGNOSIS — I10 Essential (primary) hypertension: Secondary | ICD-10-CM | POA: Diagnosis not present

## 2019-08-20 DIAGNOSIS — R634 Abnormal weight loss: Secondary | ICD-10-CM | POA: Diagnosis not present

## 2019-08-20 DIAGNOSIS — N401 Enlarged prostate with lower urinary tract symptoms: Secondary | ICD-10-CM | POA: Diagnosis not present

## 2019-08-20 DIAGNOSIS — R1310 Dysphagia, unspecified: Secondary | ICD-10-CM | POA: Diagnosis not present

## 2019-08-20 DIAGNOSIS — E785 Hyperlipidemia, unspecified: Secondary | ICD-10-CM | POA: Diagnosis not present

## 2019-08-30 DIAGNOSIS — R339 Retention of urine, unspecified: Secondary | ICD-10-CM | POA: Diagnosis not present

## 2019-08-30 DIAGNOSIS — G40909 Epilepsy, unspecified, not intractable, without status epilepticus: Secondary | ICD-10-CM | POA: Diagnosis not present

## 2019-08-30 DIAGNOSIS — G20 Parkinson's disease: Secondary | ICD-10-CM | POA: Diagnosis not present

## 2019-08-30 DIAGNOSIS — R1310 Dysphagia, unspecified: Secondary | ICD-10-CM | POA: Diagnosis not present

## 2019-08-30 DIAGNOSIS — N401 Enlarged prostate with lower urinary tract symptoms: Secondary | ICD-10-CM | POA: Diagnosis not present

## 2019-08-30 DIAGNOSIS — R634 Abnormal weight loss: Secondary | ICD-10-CM | POA: Diagnosis not present

## 2019-09-01 DIAGNOSIS — R634 Abnormal weight loss: Secondary | ICD-10-CM | POA: Diagnosis not present

## 2019-09-01 DIAGNOSIS — G20 Parkinson's disease: Secondary | ICD-10-CM | POA: Diagnosis not present

## 2019-09-01 DIAGNOSIS — R339 Retention of urine, unspecified: Secondary | ICD-10-CM | POA: Diagnosis not present

## 2019-09-01 DIAGNOSIS — G40909 Epilepsy, unspecified, not intractable, without status epilepticus: Secondary | ICD-10-CM | POA: Diagnosis not present

## 2019-09-01 DIAGNOSIS — N401 Enlarged prostate with lower urinary tract symptoms: Secondary | ICD-10-CM | POA: Diagnosis not present

## 2019-09-01 DIAGNOSIS — R1310 Dysphagia, unspecified: Secondary | ICD-10-CM | POA: Diagnosis not present

## 2019-09-03 DIAGNOSIS — R339 Retention of urine, unspecified: Secondary | ICD-10-CM | POA: Diagnosis not present

## 2019-09-03 DIAGNOSIS — G20 Parkinson's disease: Secondary | ICD-10-CM | POA: Diagnosis not present

## 2019-09-03 DIAGNOSIS — R1310 Dysphagia, unspecified: Secondary | ICD-10-CM | POA: Diagnosis not present

## 2019-09-03 DIAGNOSIS — G40909 Epilepsy, unspecified, not intractable, without status epilepticus: Secondary | ICD-10-CM | POA: Diagnosis not present

## 2019-09-03 DIAGNOSIS — R634 Abnormal weight loss: Secondary | ICD-10-CM | POA: Diagnosis not present

## 2019-09-03 DIAGNOSIS — N401 Enlarged prostate with lower urinary tract symptoms: Secondary | ICD-10-CM | POA: Diagnosis not present

## 2019-09-09 DIAGNOSIS — G40909 Epilepsy, unspecified, not intractable, without status epilepticus: Secondary | ICD-10-CM | POA: Diagnosis not present

## 2019-09-09 DIAGNOSIS — R339 Retention of urine, unspecified: Secondary | ICD-10-CM | POA: Diagnosis not present

## 2019-09-09 DIAGNOSIS — R1310 Dysphagia, unspecified: Secondary | ICD-10-CM | POA: Diagnosis not present

## 2019-09-09 DIAGNOSIS — N401 Enlarged prostate with lower urinary tract symptoms: Secondary | ICD-10-CM | POA: Diagnosis not present

## 2019-09-09 DIAGNOSIS — R634 Abnormal weight loss: Secondary | ICD-10-CM | POA: Diagnosis not present

## 2019-09-09 DIAGNOSIS — G20 Parkinson's disease: Secondary | ICD-10-CM | POA: Diagnosis not present

## 2019-09-13 DIAGNOSIS — R339 Retention of urine, unspecified: Secondary | ICD-10-CM | POA: Diagnosis not present

## 2019-09-13 DIAGNOSIS — G20 Parkinson's disease: Secondary | ICD-10-CM | POA: Diagnosis not present

## 2019-09-13 DIAGNOSIS — N401 Enlarged prostate with lower urinary tract symptoms: Secondary | ICD-10-CM | POA: Diagnosis not present

## 2019-09-13 DIAGNOSIS — R634 Abnormal weight loss: Secondary | ICD-10-CM | POA: Diagnosis not present

## 2019-09-13 DIAGNOSIS — G40909 Epilepsy, unspecified, not intractable, without status epilepticus: Secondary | ICD-10-CM | POA: Diagnosis not present

## 2019-09-13 DIAGNOSIS — R1310 Dysphagia, unspecified: Secondary | ICD-10-CM | POA: Diagnosis not present

## 2019-09-15 DIAGNOSIS — G20 Parkinson's disease: Secondary | ICD-10-CM | POA: Diagnosis not present

## 2019-09-15 DIAGNOSIS — R634 Abnormal weight loss: Secondary | ICD-10-CM | POA: Diagnosis not present

## 2019-09-15 DIAGNOSIS — G40909 Epilepsy, unspecified, not intractable, without status epilepticus: Secondary | ICD-10-CM | POA: Diagnosis not present

## 2019-09-15 DIAGNOSIS — R339 Retention of urine, unspecified: Secondary | ICD-10-CM | POA: Diagnosis not present

## 2019-09-15 DIAGNOSIS — R1310 Dysphagia, unspecified: Secondary | ICD-10-CM | POA: Diagnosis not present

## 2019-09-15 DIAGNOSIS — N401 Enlarged prostate with lower urinary tract symptoms: Secondary | ICD-10-CM | POA: Diagnosis not present

## 2019-09-16 DIAGNOSIS — R634 Abnormal weight loss: Secondary | ICD-10-CM | POA: Diagnosis not present

## 2019-09-16 DIAGNOSIS — R1310 Dysphagia, unspecified: Secondary | ICD-10-CM | POA: Diagnosis not present

## 2019-09-16 DIAGNOSIS — N401 Enlarged prostate with lower urinary tract symptoms: Secondary | ICD-10-CM | POA: Diagnosis not present

## 2019-09-16 DIAGNOSIS — G20 Parkinson's disease: Secondary | ICD-10-CM | POA: Diagnosis not present

## 2019-09-16 DIAGNOSIS — G40909 Epilepsy, unspecified, not intractable, without status epilepticus: Secondary | ICD-10-CM | POA: Diagnosis not present

## 2019-09-16 DIAGNOSIS — R339 Retention of urine, unspecified: Secondary | ICD-10-CM | POA: Diagnosis not present

## 2019-09-18 DIAGNOSIS — N401 Enlarged prostate with lower urinary tract symptoms: Secondary | ICD-10-CM | POA: Diagnosis not present

## 2019-09-18 DIAGNOSIS — R1310 Dysphagia, unspecified: Secondary | ICD-10-CM | POA: Diagnosis not present

## 2019-09-18 DIAGNOSIS — G40909 Epilepsy, unspecified, not intractable, without status epilepticus: Secondary | ICD-10-CM | POA: Diagnosis not present

## 2019-09-18 DIAGNOSIS — R634 Abnormal weight loss: Secondary | ICD-10-CM | POA: Diagnosis not present

## 2019-09-18 DIAGNOSIS — G20 Parkinson's disease: Secondary | ICD-10-CM | POA: Diagnosis not present

## 2019-09-18 DIAGNOSIS — R339 Retention of urine, unspecified: Secondary | ICD-10-CM | POA: Diagnosis not present

## 2019-09-20 DIAGNOSIS — R339 Retention of urine, unspecified: Secondary | ICD-10-CM | POA: Diagnosis not present

## 2019-09-20 DIAGNOSIS — E785 Hyperlipidemia, unspecified: Secondary | ICD-10-CM | POA: Diagnosis not present

## 2019-09-20 DIAGNOSIS — R1310 Dysphagia, unspecified: Secondary | ICD-10-CM | POA: Diagnosis not present

## 2019-09-20 DIAGNOSIS — I10 Essential (primary) hypertension: Secondary | ICD-10-CM | POA: Diagnosis not present

## 2019-09-20 DIAGNOSIS — R634 Abnormal weight loss: Secondary | ICD-10-CM | POA: Diagnosis not present

## 2019-09-20 DIAGNOSIS — G20 Parkinson's disease: Secondary | ICD-10-CM | POA: Diagnosis not present

## 2019-09-20 DIAGNOSIS — Z436 Encounter for attention to other artificial openings of urinary tract: Secondary | ICD-10-CM | POA: Diagnosis not present

## 2019-09-20 DIAGNOSIS — G40909 Epilepsy, unspecified, not intractable, without status epilepticus: Secondary | ICD-10-CM | POA: Diagnosis not present

## 2019-09-20 DIAGNOSIS — N401 Enlarged prostate with lower urinary tract symptoms: Secondary | ICD-10-CM | POA: Diagnosis not present

## 2019-09-27 DIAGNOSIS — G20 Parkinson's disease: Secondary | ICD-10-CM | POA: Diagnosis not present

## 2019-09-27 DIAGNOSIS — R1310 Dysphagia, unspecified: Secondary | ICD-10-CM | POA: Diagnosis not present

## 2019-09-27 DIAGNOSIS — R339 Retention of urine, unspecified: Secondary | ICD-10-CM | POA: Diagnosis not present

## 2019-09-27 DIAGNOSIS — R634 Abnormal weight loss: Secondary | ICD-10-CM | POA: Diagnosis not present

## 2019-09-27 DIAGNOSIS — N401 Enlarged prostate with lower urinary tract symptoms: Secondary | ICD-10-CM | POA: Diagnosis not present

## 2019-09-27 DIAGNOSIS — G40909 Epilepsy, unspecified, not intractable, without status epilepticus: Secondary | ICD-10-CM | POA: Diagnosis not present

## 2019-09-28 DIAGNOSIS — R339 Retention of urine, unspecified: Secondary | ICD-10-CM | POA: Diagnosis not present

## 2019-09-28 DIAGNOSIS — N401 Enlarged prostate with lower urinary tract symptoms: Secondary | ICD-10-CM | POA: Diagnosis not present

## 2019-09-28 DIAGNOSIS — G40909 Epilepsy, unspecified, not intractable, without status epilepticus: Secondary | ICD-10-CM | POA: Diagnosis not present

## 2019-09-28 DIAGNOSIS — R634 Abnormal weight loss: Secondary | ICD-10-CM | POA: Diagnosis not present

## 2019-09-28 DIAGNOSIS — R1310 Dysphagia, unspecified: Secondary | ICD-10-CM | POA: Diagnosis not present

## 2019-09-28 DIAGNOSIS — G20 Parkinson's disease: Secondary | ICD-10-CM | POA: Diagnosis not present

## 2019-10-02 DIAGNOSIS — R339 Retention of urine, unspecified: Secondary | ICD-10-CM | POA: Diagnosis not present

## 2019-10-02 DIAGNOSIS — G20 Parkinson's disease: Secondary | ICD-10-CM | POA: Diagnosis not present

## 2019-10-02 DIAGNOSIS — G40909 Epilepsy, unspecified, not intractable, without status epilepticus: Secondary | ICD-10-CM | POA: Diagnosis not present

## 2019-10-02 DIAGNOSIS — R1310 Dysphagia, unspecified: Secondary | ICD-10-CM | POA: Diagnosis not present

## 2019-10-02 DIAGNOSIS — R634 Abnormal weight loss: Secondary | ICD-10-CM | POA: Diagnosis not present

## 2019-10-02 DIAGNOSIS — N401 Enlarged prostate with lower urinary tract symptoms: Secondary | ICD-10-CM | POA: Diagnosis not present

## 2019-10-06 DIAGNOSIS — R1310 Dysphagia, unspecified: Secondary | ICD-10-CM | POA: Diagnosis not present

## 2019-10-06 DIAGNOSIS — R634 Abnormal weight loss: Secondary | ICD-10-CM | POA: Diagnosis not present

## 2019-10-06 DIAGNOSIS — G40909 Epilepsy, unspecified, not intractable, without status epilepticus: Secondary | ICD-10-CM | POA: Diagnosis not present

## 2019-10-06 DIAGNOSIS — N401 Enlarged prostate with lower urinary tract symptoms: Secondary | ICD-10-CM | POA: Diagnosis not present

## 2019-10-06 DIAGNOSIS — G20 Parkinson's disease: Secondary | ICD-10-CM | POA: Diagnosis not present

## 2019-10-06 DIAGNOSIS — R339 Retention of urine, unspecified: Secondary | ICD-10-CM | POA: Diagnosis not present

## 2019-10-10 DIAGNOSIS — R1310 Dysphagia, unspecified: Secondary | ICD-10-CM | POA: Diagnosis not present

## 2019-10-10 DIAGNOSIS — R634 Abnormal weight loss: Secondary | ICD-10-CM | POA: Diagnosis not present

## 2019-10-10 DIAGNOSIS — R339 Retention of urine, unspecified: Secondary | ICD-10-CM | POA: Diagnosis not present

## 2019-10-10 DIAGNOSIS — G20 Parkinson's disease: Secondary | ICD-10-CM | POA: Diagnosis not present

## 2019-10-10 DIAGNOSIS — G40909 Epilepsy, unspecified, not intractable, without status epilepticus: Secondary | ICD-10-CM | POA: Diagnosis not present

## 2019-10-10 DIAGNOSIS — N401 Enlarged prostate with lower urinary tract symptoms: Secondary | ICD-10-CM | POA: Diagnosis not present

## 2019-10-11 DIAGNOSIS — G20 Parkinson's disease: Secondary | ICD-10-CM | POA: Diagnosis not present

## 2019-10-11 DIAGNOSIS — R1310 Dysphagia, unspecified: Secondary | ICD-10-CM | POA: Diagnosis not present

## 2019-10-11 DIAGNOSIS — G40909 Epilepsy, unspecified, not intractable, without status epilepticus: Secondary | ICD-10-CM | POA: Diagnosis not present

## 2019-10-11 DIAGNOSIS — N401 Enlarged prostate with lower urinary tract symptoms: Secondary | ICD-10-CM | POA: Diagnosis not present

## 2019-10-11 DIAGNOSIS — R339 Retention of urine, unspecified: Secondary | ICD-10-CM | POA: Diagnosis not present

## 2019-10-11 DIAGNOSIS — R634 Abnormal weight loss: Secondary | ICD-10-CM | POA: Diagnosis not present

## 2019-10-17 DIAGNOSIS — G20 Parkinson's disease: Secondary | ICD-10-CM | POA: Diagnosis not present

## 2019-10-17 DIAGNOSIS — G40909 Epilepsy, unspecified, not intractable, without status epilepticus: Secondary | ICD-10-CM | POA: Diagnosis not present

## 2019-10-17 DIAGNOSIS — R1310 Dysphagia, unspecified: Secondary | ICD-10-CM | POA: Diagnosis not present

## 2019-10-17 DIAGNOSIS — R339 Retention of urine, unspecified: Secondary | ICD-10-CM | POA: Diagnosis not present

## 2019-10-17 DIAGNOSIS — N401 Enlarged prostate with lower urinary tract symptoms: Secondary | ICD-10-CM | POA: Diagnosis not present

## 2019-10-17 DIAGNOSIS — R634 Abnormal weight loss: Secondary | ICD-10-CM | POA: Diagnosis not present

## 2019-10-20 DIAGNOSIS — Z436 Encounter for attention to other artificial openings of urinary tract: Secondary | ICD-10-CM | POA: Diagnosis not present

## 2019-10-20 DIAGNOSIS — R1310 Dysphagia, unspecified: Secondary | ICD-10-CM | POA: Diagnosis not present

## 2019-10-20 DIAGNOSIS — G20 Parkinson's disease: Secondary | ICD-10-CM | POA: Diagnosis not present

## 2019-10-20 DIAGNOSIS — N401 Enlarged prostate with lower urinary tract symptoms: Secondary | ICD-10-CM | POA: Diagnosis not present

## 2019-10-20 DIAGNOSIS — R634 Abnormal weight loss: Secondary | ICD-10-CM | POA: Diagnosis not present

## 2019-10-20 DIAGNOSIS — G40909 Epilepsy, unspecified, not intractable, without status epilepticus: Secondary | ICD-10-CM | POA: Diagnosis not present

## 2019-10-20 DIAGNOSIS — E785 Hyperlipidemia, unspecified: Secondary | ICD-10-CM | POA: Diagnosis not present

## 2019-10-20 DIAGNOSIS — R339 Retention of urine, unspecified: Secondary | ICD-10-CM | POA: Diagnosis not present

## 2019-10-20 DIAGNOSIS — I10 Essential (primary) hypertension: Secondary | ICD-10-CM | POA: Diagnosis not present

## 2019-10-23 DIAGNOSIS — G40909 Epilepsy, unspecified, not intractable, without status epilepticus: Secondary | ICD-10-CM | POA: Diagnosis not present

## 2019-10-23 DIAGNOSIS — G20 Parkinson's disease: Secondary | ICD-10-CM | POA: Diagnosis not present

## 2019-10-23 DIAGNOSIS — R339 Retention of urine, unspecified: Secondary | ICD-10-CM | POA: Diagnosis not present

## 2019-10-23 DIAGNOSIS — R634 Abnormal weight loss: Secondary | ICD-10-CM | POA: Diagnosis not present

## 2019-10-23 DIAGNOSIS — R1310 Dysphagia, unspecified: Secondary | ICD-10-CM | POA: Diagnosis not present

## 2019-10-23 DIAGNOSIS — N401 Enlarged prostate with lower urinary tract symptoms: Secondary | ICD-10-CM | POA: Diagnosis not present

## 2019-10-27 DIAGNOSIS — G20 Parkinson's disease: Secondary | ICD-10-CM | POA: Diagnosis not present

## 2019-10-27 DIAGNOSIS — G40909 Epilepsy, unspecified, not intractable, without status epilepticus: Secondary | ICD-10-CM | POA: Diagnosis not present

## 2019-10-27 DIAGNOSIS — R634 Abnormal weight loss: Secondary | ICD-10-CM | POA: Diagnosis not present

## 2019-10-27 DIAGNOSIS — R1310 Dysphagia, unspecified: Secondary | ICD-10-CM | POA: Diagnosis not present

## 2019-10-27 DIAGNOSIS — R339 Retention of urine, unspecified: Secondary | ICD-10-CM | POA: Diagnosis not present

## 2019-10-27 DIAGNOSIS — N401 Enlarged prostate with lower urinary tract symptoms: Secondary | ICD-10-CM | POA: Diagnosis not present

## 2019-10-30 DIAGNOSIS — R1310 Dysphagia, unspecified: Secondary | ICD-10-CM | POA: Diagnosis not present

## 2019-10-30 DIAGNOSIS — R634 Abnormal weight loss: Secondary | ICD-10-CM | POA: Diagnosis not present

## 2019-10-30 DIAGNOSIS — R339 Retention of urine, unspecified: Secondary | ICD-10-CM | POA: Diagnosis not present

## 2019-10-30 DIAGNOSIS — G20 Parkinson's disease: Secondary | ICD-10-CM | POA: Diagnosis not present

## 2019-10-30 DIAGNOSIS — N401 Enlarged prostate with lower urinary tract symptoms: Secondary | ICD-10-CM | POA: Diagnosis not present

## 2019-10-30 DIAGNOSIS — G40909 Epilepsy, unspecified, not intractable, without status epilepticus: Secondary | ICD-10-CM | POA: Diagnosis not present

## 2019-11-03 DIAGNOSIS — N401 Enlarged prostate with lower urinary tract symptoms: Secondary | ICD-10-CM | POA: Diagnosis not present

## 2019-11-03 DIAGNOSIS — R339 Retention of urine, unspecified: Secondary | ICD-10-CM | POA: Diagnosis not present

## 2019-11-03 DIAGNOSIS — R1310 Dysphagia, unspecified: Secondary | ICD-10-CM | POA: Diagnosis not present

## 2019-11-03 DIAGNOSIS — G20 Parkinson's disease: Secondary | ICD-10-CM | POA: Diagnosis not present

## 2019-11-03 DIAGNOSIS — G40909 Epilepsy, unspecified, not intractable, without status epilepticus: Secondary | ICD-10-CM | POA: Diagnosis not present

## 2019-11-03 DIAGNOSIS — R634 Abnormal weight loss: Secondary | ICD-10-CM | POA: Diagnosis not present

## 2019-11-04 DIAGNOSIS — G40909 Epilepsy, unspecified, not intractable, without status epilepticus: Secondary | ICD-10-CM | POA: Diagnosis not present

## 2019-11-04 DIAGNOSIS — R634 Abnormal weight loss: Secondary | ICD-10-CM | POA: Diagnosis not present

## 2019-11-04 DIAGNOSIS — N401 Enlarged prostate with lower urinary tract symptoms: Secondary | ICD-10-CM | POA: Diagnosis not present

## 2019-11-04 DIAGNOSIS — G20 Parkinson's disease: Secondary | ICD-10-CM | POA: Diagnosis not present

## 2019-11-04 DIAGNOSIS — R339 Retention of urine, unspecified: Secondary | ICD-10-CM | POA: Diagnosis not present

## 2019-11-04 DIAGNOSIS — R1310 Dysphagia, unspecified: Secondary | ICD-10-CM | POA: Diagnosis not present

## 2019-11-05 DIAGNOSIS — R339 Retention of urine, unspecified: Secondary | ICD-10-CM | POA: Diagnosis not present

## 2019-11-05 DIAGNOSIS — R634 Abnormal weight loss: Secondary | ICD-10-CM | POA: Diagnosis not present

## 2019-11-05 DIAGNOSIS — G40909 Epilepsy, unspecified, not intractable, without status epilepticus: Secondary | ICD-10-CM | POA: Diagnosis not present

## 2019-11-05 DIAGNOSIS — G20 Parkinson's disease: Secondary | ICD-10-CM | POA: Diagnosis not present

## 2019-11-05 DIAGNOSIS — N401 Enlarged prostate with lower urinary tract symptoms: Secondary | ICD-10-CM | POA: Diagnosis not present

## 2019-11-05 DIAGNOSIS — R1310 Dysphagia, unspecified: Secondary | ICD-10-CM | POA: Diagnosis not present

## 2019-11-06 DIAGNOSIS — R634 Abnormal weight loss: Secondary | ICD-10-CM | POA: Diagnosis not present

## 2019-11-06 DIAGNOSIS — R339 Retention of urine, unspecified: Secondary | ICD-10-CM | POA: Diagnosis not present

## 2019-11-06 DIAGNOSIS — G40909 Epilepsy, unspecified, not intractable, without status epilepticus: Secondary | ICD-10-CM | POA: Diagnosis not present

## 2019-11-06 DIAGNOSIS — G20 Parkinson's disease: Secondary | ICD-10-CM | POA: Diagnosis not present

## 2019-11-06 DIAGNOSIS — R1310 Dysphagia, unspecified: Secondary | ICD-10-CM | POA: Diagnosis not present

## 2019-11-06 DIAGNOSIS — N401 Enlarged prostate with lower urinary tract symptoms: Secondary | ICD-10-CM | POA: Diagnosis not present

## 2019-11-10 DIAGNOSIS — N401 Enlarged prostate with lower urinary tract symptoms: Secondary | ICD-10-CM | POA: Diagnosis not present

## 2019-11-10 DIAGNOSIS — R1310 Dysphagia, unspecified: Secondary | ICD-10-CM | POA: Diagnosis not present

## 2019-11-10 DIAGNOSIS — R634 Abnormal weight loss: Secondary | ICD-10-CM | POA: Diagnosis not present

## 2019-11-10 DIAGNOSIS — G20 Parkinson's disease: Secondary | ICD-10-CM | POA: Diagnosis not present

## 2019-11-10 DIAGNOSIS — G40909 Epilepsy, unspecified, not intractable, without status epilepticus: Secondary | ICD-10-CM | POA: Diagnosis not present

## 2019-11-10 DIAGNOSIS — R339 Retention of urine, unspecified: Secondary | ICD-10-CM | POA: Diagnosis not present

## 2019-11-13 DIAGNOSIS — G20 Parkinson's disease: Secondary | ICD-10-CM | POA: Diagnosis not present

## 2019-11-13 DIAGNOSIS — R339 Retention of urine, unspecified: Secondary | ICD-10-CM | POA: Diagnosis not present

## 2019-11-13 DIAGNOSIS — R634 Abnormal weight loss: Secondary | ICD-10-CM | POA: Diagnosis not present

## 2019-11-13 DIAGNOSIS — R1310 Dysphagia, unspecified: Secondary | ICD-10-CM | POA: Diagnosis not present

## 2019-11-13 DIAGNOSIS — G40909 Epilepsy, unspecified, not intractable, without status epilepticus: Secondary | ICD-10-CM | POA: Diagnosis not present

## 2019-11-13 DIAGNOSIS — N401 Enlarged prostate with lower urinary tract symptoms: Secondary | ICD-10-CM | POA: Diagnosis not present

## 2019-11-14 DIAGNOSIS — R634 Abnormal weight loss: Secondary | ICD-10-CM | POA: Diagnosis not present

## 2019-11-14 DIAGNOSIS — R339 Retention of urine, unspecified: Secondary | ICD-10-CM | POA: Diagnosis not present

## 2019-11-14 DIAGNOSIS — R1310 Dysphagia, unspecified: Secondary | ICD-10-CM | POA: Diagnosis not present

## 2019-11-14 DIAGNOSIS — N401 Enlarged prostate with lower urinary tract symptoms: Secondary | ICD-10-CM | POA: Diagnosis not present

## 2019-11-14 DIAGNOSIS — G40909 Epilepsy, unspecified, not intractable, without status epilepticus: Secondary | ICD-10-CM | POA: Diagnosis not present

## 2019-11-14 DIAGNOSIS — G20 Parkinson's disease: Secondary | ICD-10-CM | POA: Diagnosis not present

## 2019-11-15 DIAGNOSIS — R1310 Dysphagia, unspecified: Secondary | ICD-10-CM | POA: Diagnosis not present

## 2019-11-15 DIAGNOSIS — R339 Retention of urine, unspecified: Secondary | ICD-10-CM | POA: Diagnosis not present

## 2019-11-15 DIAGNOSIS — N401 Enlarged prostate with lower urinary tract symptoms: Secondary | ICD-10-CM | POA: Diagnosis not present

## 2019-11-15 DIAGNOSIS — G40909 Epilepsy, unspecified, not intractable, without status epilepticus: Secondary | ICD-10-CM | POA: Diagnosis not present

## 2019-11-15 DIAGNOSIS — R634 Abnormal weight loss: Secondary | ICD-10-CM | POA: Diagnosis not present

## 2019-11-15 DIAGNOSIS — G20 Parkinson's disease: Secondary | ICD-10-CM | POA: Diagnosis not present

## 2019-11-20 DIAGNOSIS — R634 Abnormal weight loss: Secondary | ICD-10-CM | POA: Diagnosis not present

## 2019-11-20 DIAGNOSIS — R339 Retention of urine, unspecified: Secondary | ICD-10-CM | POA: Diagnosis not present

## 2019-11-20 DIAGNOSIS — N401 Enlarged prostate with lower urinary tract symptoms: Secondary | ICD-10-CM | POA: Diagnosis not present

## 2019-11-20 DIAGNOSIS — G20 Parkinson's disease: Secondary | ICD-10-CM | POA: Diagnosis not present

## 2019-11-20 DIAGNOSIS — E785 Hyperlipidemia, unspecified: Secondary | ICD-10-CM | POA: Diagnosis not present

## 2019-11-20 DIAGNOSIS — I10 Essential (primary) hypertension: Secondary | ICD-10-CM | POA: Diagnosis not present

## 2019-11-20 DIAGNOSIS — Z436 Encounter for attention to other artificial openings of urinary tract: Secondary | ICD-10-CM | POA: Diagnosis not present

## 2019-11-20 DIAGNOSIS — G40909 Epilepsy, unspecified, not intractable, without status epilepticus: Secondary | ICD-10-CM | POA: Diagnosis not present

## 2019-11-20 DIAGNOSIS — R1310 Dysphagia, unspecified: Secondary | ICD-10-CM | POA: Diagnosis not present

## 2019-11-21 DIAGNOSIS — N401 Enlarged prostate with lower urinary tract symptoms: Secondary | ICD-10-CM | POA: Diagnosis not present

## 2019-11-21 DIAGNOSIS — G40909 Epilepsy, unspecified, not intractable, without status epilepticus: Secondary | ICD-10-CM | POA: Diagnosis not present

## 2019-11-21 DIAGNOSIS — R634 Abnormal weight loss: Secondary | ICD-10-CM | POA: Diagnosis not present

## 2019-11-21 DIAGNOSIS — R1310 Dysphagia, unspecified: Secondary | ICD-10-CM | POA: Diagnosis not present

## 2019-11-21 DIAGNOSIS — R339 Retention of urine, unspecified: Secondary | ICD-10-CM | POA: Diagnosis not present

## 2019-11-21 DIAGNOSIS — G20 Parkinson's disease: Secondary | ICD-10-CM | POA: Diagnosis not present

## 2019-11-23 DIAGNOSIS — R634 Abnormal weight loss: Secondary | ICD-10-CM | POA: Diagnosis not present

## 2019-11-23 DIAGNOSIS — R1310 Dysphagia, unspecified: Secondary | ICD-10-CM | POA: Diagnosis not present

## 2019-11-23 DIAGNOSIS — N401 Enlarged prostate with lower urinary tract symptoms: Secondary | ICD-10-CM | POA: Diagnosis not present

## 2019-11-23 DIAGNOSIS — G20 Parkinson's disease: Secondary | ICD-10-CM | POA: Diagnosis not present

## 2019-11-23 DIAGNOSIS — R339 Retention of urine, unspecified: Secondary | ICD-10-CM | POA: Diagnosis not present

## 2019-11-23 DIAGNOSIS — G40909 Epilepsy, unspecified, not intractable, without status epilepticus: Secondary | ICD-10-CM | POA: Diagnosis not present

## 2019-11-24 DIAGNOSIS — R1310 Dysphagia, unspecified: Secondary | ICD-10-CM | POA: Diagnosis not present

## 2019-11-24 DIAGNOSIS — G20 Parkinson's disease: Secondary | ICD-10-CM | POA: Diagnosis not present

## 2019-11-24 DIAGNOSIS — R634 Abnormal weight loss: Secondary | ICD-10-CM | POA: Diagnosis not present

## 2019-11-24 DIAGNOSIS — N401 Enlarged prostate with lower urinary tract symptoms: Secondary | ICD-10-CM | POA: Diagnosis not present

## 2019-11-24 DIAGNOSIS — R339 Retention of urine, unspecified: Secondary | ICD-10-CM | POA: Diagnosis not present

## 2019-11-24 DIAGNOSIS — G40909 Epilepsy, unspecified, not intractable, without status epilepticus: Secondary | ICD-10-CM | POA: Diagnosis not present

## 2019-11-28 DIAGNOSIS — R1310 Dysphagia, unspecified: Secondary | ICD-10-CM | POA: Diagnosis not present

## 2019-11-28 DIAGNOSIS — G20 Parkinson's disease: Secondary | ICD-10-CM | POA: Diagnosis not present

## 2019-11-28 DIAGNOSIS — R339 Retention of urine, unspecified: Secondary | ICD-10-CM | POA: Diagnosis not present

## 2019-11-28 DIAGNOSIS — N401 Enlarged prostate with lower urinary tract symptoms: Secondary | ICD-10-CM | POA: Diagnosis not present

## 2019-11-28 DIAGNOSIS — G40909 Epilepsy, unspecified, not intractable, without status epilepticus: Secondary | ICD-10-CM | POA: Diagnosis not present

## 2019-11-28 DIAGNOSIS — R634 Abnormal weight loss: Secondary | ICD-10-CM | POA: Diagnosis not present

## 2019-12-01 DIAGNOSIS — R634 Abnormal weight loss: Secondary | ICD-10-CM | POA: Diagnosis not present

## 2019-12-01 DIAGNOSIS — G40909 Epilepsy, unspecified, not intractable, without status epilepticus: Secondary | ICD-10-CM | POA: Diagnosis not present

## 2019-12-01 DIAGNOSIS — R339 Retention of urine, unspecified: Secondary | ICD-10-CM | POA: Diagnosis not present

## 2019-12-01 DIAGNOSIS — R1310 Dysphagia, unspecified: Secondary | ICD-10-CM | POA: Diagnosis not present

## 2019-12-01 DIAGNOSIS — G20 Parkinson's disease: Secondary | ICD-10-CM | POA: Diagnosis not present

## 2019-12-01 DIAGNOSIS — N401 Enlarged prostate with lower urinary tract symptoms: Secondary | ICD-10-CM | POA: Diagnosis not present

## 2019-12-05 DIAGNOSIS — N401 Enlarged prostate with lower urinary tract symptoms: Secondary | ICD-10-CM | POA: Diagnosis not present

## 2019-12-05 DIAGNOSIS — R634 Abnormal weight loss: Secondary | ICD-10-CM | POA: Diagnosis not present

## 2019-12-05 DIAGNOSIS — G40909 Epilepsy, unspecified, not intractable, without status epilepticus: Secondary | ICD-10-CM | POA: Diagnosis not present

## 2019-12-05 DIAGNOSIS — G20 Parkinson's disease: Secondary | ICD-10-CM | POA: Diagnosis not present

## 2019-12-05 DIAGNOSIS — R339 Retention of urine, unspecified: Secondary | ICD-10-CM | POA: Diagnosis not present

## 2019-12-05 DIAGNOSIS — R1310 Dysphagia, unspecified: Secondary | ICD-10-CM | POA: Diagnosis not present

## 2019-12-08 DIAGNOSIS — R339 Retention of urine, unspecified: Secondary | ICD-10-CM | POA: Diagnosis not present

## 2019-12-08 DIAGNOSIS — R634 Abnormal weight loss: Secondary | ICD-10-CM | POA: Diagnosis not present

## 2019-12-08 DIAGNOSIS — N401 Enlarged prostate with lower urinary tract symptoms: Secondary | ICD-10-CM | POA: Diagnosis not present

## 2019-12-08 DIAGNOSIS — R1310 Dysphagia, unspecified: Secondary | ICD-10-CM | POA: Diagnosis not present

## 2019-12-08 DIAGNOSIS — G40909 Epilepsy, unspecified, not intractable, without status epilepticus: Secondary | ICD-10-CM | POA: Diagnosis not present

## 2019-12-08 DIAGNOSIS — G20 Parkinson's disease: Secondary | ICD-10-CM | POA: Diagnosis not present

## 2019-12-09 DIAGNOSIS — G20 Parkinson's disease: Secondary | ICD-10-CM | POA: Diagnosis not present

## 2019-12-09 DIAGNOSIS — R634 Abnormal weight loss: Secondary | ICD-10-CM | POA: Diagnosis not present

## 2019-12-09 DIAGNOSIS — R1310 Dysphagia, unspecified: Secondary | ICD-10-CM | POA: Diagnosis not present

## 2019-12-09 DIAGNOSIS — N401 Enlarged prostate with lower urinary tract symptoms: Secondary | ICD-10-CM | POA: Diagnosis not present

## 2019-12-09 DIAGNOSIS — G40909 Epilepsy, unspecified, not intractable, without status epilepticus: Secondary | ICD-10-CM | POA: Diagnosis not present

## 2019-12-09 DIAGNOSIS — R339 Retention of urine, unspecified: Secondary | ICD-10-CM | POA: Diagnosis not present

## 2019-12-10 DIAGNOSIS — N401 Enlarged prostate with lower urinary tract symptoms: Secondary | ICD-10-CM | POA: Diagnosis not present

## 2019-12-10 DIAGNOSIS — R339 Retention of urine, unspecified: Secondary | ICD-10-CM | POA: Diagnosis not present

## 2019-12-10 DIAGNOSIS — G20 Parkinson's disease: Secondary | ICD-10-CM | POA: Diagnosis not present

## 2019-12-10 DIAGNOSIS — R634 Abnormal weight loss: Secondary | ICD-10-CM | POA: Diagnosis not present

## 2019-12-10 DIAGNOSIS — R1310 Dysphagia, unspecified: Secondary | ICD-10-CM | POA: Diagnosis not present

## 2019-12-10 DIAGNOSIS — G40909 Epilepsy, unspecified, not intractable, without status epilepticus: Secondary | ICD-10-CM | POA: Diagnosis not present

## 2019-12-12 DIAGNOSIS — G20 Parkinson's disease: Secondary | ICD-10-CM | POA: Diagnosis not present

## 2019-12-12 DIAGNOSIS — N401 Enlarged prostate with lower urinary tract symptoms: Secondary | ICD-10-CM | POA: Diagnosis not present

## 2019-12-12 DIAGNOSIS — R339 Retention of urine, unspecified: Secondary | ICD-10-CM | POA: Diagnosis not present

## 2019-12-12 DIAGNOSIS — R634 Abnormal weight loss: Secondary | ICD-10-CM | POA: Diagnosis not present

## 2019-12-12 DIAGNOSIS — R1310 Dysphagia, unspecified: Secondary | ICD-10-CM | POA: Diagnosis not present

## 2019-12-12 DIAGNOSIS — G40909 Epilepsy, unspecified, not intractable, without status epilepticus: Secondary | ICD-10-CM | POA: Diagnosis not present

## 2019-12-19 DIAGNOSIS — G20 Parkinson's disease: Secondary | ICD-10-CM | POA: Diagnosis not present

## 2019-12-19 DIAGNOSIS — G40909 Epilepsy, unspecified, not intractable, without status epilepticus: Secondary | ICD-10-CM | POA: Diagnosis not present

## 2019-12-19 DIAGNOSIS — N401 Enlarged prostate with lower urinary tract symptoms: Secondary | ICD-10-CM | POA: Diagnosis not present

## 2019-12-19 DIAGNOSIS — R634 Abnormal weight loss: Secondary | ICD-10-CM | POA: Diagnosis not present

## 2019-12-19 DIAGNOSIS — R1310 Dysphagia, unspecified: Secondary | ICD-10-CM | POA: Diagnosis not present

## 2019-12-19 DIAGNOSIS — R339 Retention of urine, unspecified: Secondary | ICD-10-CM | POA: Diagnosis not present

## 2019-12-20 DIAGNOSIS — G40909 Epilepsy, unspecified, not intractable, without status epilepticus: Secondary | ICD-10-CM | POA: Diagnosis not present

## 2019-12-20 DIAGNOSIS — N401 Enlarged prostate with lower urinary tract symptoms: Secondary | ICD-10-CM | POA: Diagnosis not present

## 2019-12-20 DIAGNOSIS — G20 Parkinson's disease: Secondary | ICD-10-CM | POA: Diagnosis not present

## 2019-12-20 DIAGNOSIS — I10 Essential (primary) hypertension: Secondary | ICD-10-CM | POA: Diagnosis not present

## 2019-12-20 DIAGNOSIS — R634 Abnormal weight loss: Secondary | ICD-10-CM | POA: Diagnosis not present

## 2019-12-20 DIAGNOSIS — R1310 Dysphagia, unspecified: Secondary | ICD-10-CM | POA: Diagnosis not present

## 2019-12-20 DIAGNOSIS — R339 Retention of urine, unspecified: Secondary | ICD-10-CM | POA: Diagnosis not present

## 2019-12-20 DIAGNOSIS — E785 Hyperlipidemia, unspecified: Secondary | ICD-10-CM | POA: Diagnosis not present

## 2019-12-20 DIAGNOSIS — Z436 Encounter for attention to other artificial openings of urinary tract: Secondary | ICD-10-CM | POA: Diagnosis not present

## 2019-12-22 DIAGNOSIS — N401 Enlarged prostate with lower urinary tract symptoms: Secondary | ICD-10-CM | POA: Diagnosis not present

## 2019-12-22 DIAGNOSIS — G40909 Epilepsy, unspecified, not intractable, without status epilepticus: Secondary | ICD-10-CM | POA: Diagnosis not present

## 2019-12-22 DIAGNOSIS — G20 Parkinson's disease: Secondary | ICD-10-CM | POA: Diagnosis not present

## 2019-12-22 DIAGNOSIS — R339 Retention of urine, unspecified: Secondary | ICD-10-CM | POA: Diagnosis not present

## 2019-12-22 DIAGNOSIS — R634 Abnormal weight loss: Secondary | ICD-10-CM | POA: Diagnosis not present

## 2019-12-22 DIAGNOSIS — R1310 Dysphagia, unspecified: Secondary | ICD-10-CM | POA: Diagnosis not present

## 2019-12-24 DIAGNOSIS — R634 Abnormal weight loss: Secondary | ICD-10-CM | POA: Diagnosis not present

## 2019-12-24 DIAGNOSIS — G40909 Epilepsy, unspecified, not intractable, without status epilepticus: Secondary | ICD-10-CM | POA: Diagnosis not present

## 2019-12-24 DIAGNOSIS — G20 Parkinson's disease: Secondary | ICD-10-CM | POA: Diagnosis not present

## 2019-12-24 DIAGNOSIS — R1310 Dysphagia, unspecified: Secondary | ICD-10-CM | POA: Diagnosis not present

## 2019-12-24 DIAGNOSIS — R339 Retention of urine, unspecified: Secondary | ICD-10-CM | POA: Diagnosis not present

## 2019-12-24 DIAGNOSIS — N401 Enlarged prostate with lower urinary tract symptoms: Secondary | ICD-10-CM | POA: Diagnosis not present

## 2019-12-26 DIAGNOSIS — N401 Enlarged prostate with lower urinary tract symptoms: Secondary | ICD-10-CM | POA: Diagnosis not present

## 2019-12-26 DIAGNOSIS — R634 Abnormal weight loss: Secondary | ICD-10-CM | POA: Diagnosis not present

## 2019-12-26 DIAGNOSIS — R1310 Dysphagia, unspecified: Secondary | ICD-10-CM | POA: Diagnosis not present

## 2019-12-26 DIAGNOSIS — G40909 Epilepsy, unspecified, not intractable, without status epilepticus: Secondary | ICD-10-CM | POA: Diagnosis not present

## 2019-12-26 DIAGNOSIS — G20 Parkinson's disease: Secondary | ICD-10-CM | POA: Diagnosis not present

## 2019-12-26 DIAGNOSIS — R339 Retention of urine, unspecified: Secondary | ICD-10-CM | POA: Diagnosis not present

## 2019-12-28 DIAGNOSIS — R339 Retention of urine, unspecified: Secondary | ICD-10-CM | POA: Diagnosis not present

## 2019-12-28 DIAGNOSIS — G40909 Epilepsy, unspecified, not intractable, without status epilepticus: Secondary | ICD-10-CM | POA: Diagnosis not present

## 2019-12-28 DIAGNOSIS — R1310 Dysphagia, unspecified: Secondary | ICD-10-CM | POA: Diagnosis not present

## 2019-12-28 DIAGNOSIS — G20 Parkinson's disease: Secondary | ICD-10-CM | POA: Diagnosis not present

## 2019-12-28 DIAGNOSIS — R634 Abnormal weight loss: Secondary | ICD-10-CM | POA: Diagnosis not present

## 2019-12-28 DIAGNOSIS — N401 Enlarged prostate with lower urinary tract symptoms: Secondary | ICD-10-CM | POA: Diagnosis not present

## 2019-12-29 DIAGNOSIS — N401 Enlarged prostate with lower urinary tract symptoms: Secondary | ICD-10-CM | POA: Diagnosis not present

## 2019-12-29 DIAGNOSIS — R339 Retention of urine, unspecified: Secondary | ICD-10-CM | POA: Diagnosis not present

## 2019-12-29 DIAGNOSIS — R1310 Dysphagia, unspecified: Secondary | ICD-10-CM | POA: Diagnosis not present

## 2019-12-29 DIAGNOSIS — G40909 Epilepsy, unspecified, not intractable, without status epilepticus: Secondary | ICD-10-CM | POA: Diagnosis not present

## 2019-12-29 DIAGNOSIS — G20 Parkinson's disease: Secondary | ICD-10-CM | POA: Diagnosis not present

## 2019-12-29 DIAGNOSIS — R634 Abnormal weight loss: Secondary | ICD-10-CM | POA: Diagnosis not present

## 2020-01-02 DIAGNOSIS — R1310 Dysphagia, unspecified: Secondary | ICD-10-CM | POA: Diagnosis not present

## 2020-01-02 DIAGNOSIS — N401 Enlarged prostate with lower urinary tract symptoms: Secondary | ICD-10-CM | POA: Diagnosis not present

## 2020-01-02 DIAGNOSIS — G20 Parkinson's disease: Secondary | ICD-10-CM | POA: Diagnosis not present

## 2020-01-02 DIAGNOSIS — R339 Retention of urine, unspecified: Secondary | ICD-10-CM | POA: Diagnosis not present

## 2020-01-02 DIAGNOSIS — G40909 Epilepsy, unspecified, not intractable, without status epilepticus: Secondary | ICD-10-CM | POA: Diagnosis not present

## 2020-01-02 DIAGNOSIS — R634 Abnormal weight loss: Secondary | ICD-10-CM | POA: Diagnosis not present

## 2020-01-05 DIAGNOSIS — R339 Retention of urine, unspecified: Secondary | ICD-10-CM | POA: Diagnosis not present

## 2020-01-05 DIAGNOSIS — R634 Abnormal weight loss: Secondary | ICD-10-CM | POA: Diagnosis not present

## 2020-01-05 DIAGNOSIS — G40909 Epilepsy, unspecified, not intractable, without status epilepticus: Secondary | ICD-10-CM | POA: Diagnosis not present

## 2020-01-05 DIAGNOSIS — R1310 Dysphagia, unspecified: Secondary | ICD-10-CM | POA: Diagnosis not present

## 2020-01-05 DIAGNOSIS — N401 Enlarged prostate with lower urinary tract symptoms: Secondary | ICD-10-CM | POA: Diagnosis not present

## 2020-01-05 DIAGNOSIS — G20 Parkinson's disease: Secondary | ICD-10-CM | POA: Diagnosis not present

## 2020-01-09 DIAGNOSIS — R634 Abnormal weight loss: Secondary | ICD-10-CM | POA: Diagnosis not present

## 2020-01-09 DIAGNOSIS — G40909 Epilepsy, unspecified, not intractable, without status epilepticus: Secondary | ICD-10-CM | POA: Diagnosis not present

## 2020-01-09 DIAGNOSIS — G20 Parkinson's disease: Secondary | ICD-10-CM | POA: Diagnosis not present

## 2020-01-09 DIAGNOSIS — R1310 Dysphagia, unspecified: Secondary | ICD-10-CM | POA: Diagnosis not present

## 2020-01-09 DIAGNOSIS — N401 Enlarged prostate with lower urinary tract symptoms: Secondary | ICD-10-CM | POA: Diagnosis not present

## 2020-01-09 DIAGNOSIS — R339 Retention of urine, unspecified: Secondary | ICD-10-CM | POA: Diagnosis not present

## 2020-01-15 DIAGNOSIS — R339 Retention of urine, unspecified: Secondary | ICD-10-CM | POA: Diagnosis not present

## 2020-01-15 DIAGNOSIS — R634 Abnormal weight loss: Secondary | ICD-10-CM | POA: Diagnosis not present

## 2020-01-15 DIAGNOSIS — G40909 Epilepsy, unspecified, not intractable, without status epilepticus: Secondary | ICD-10-CM | POA: Diagnosis not present

## 2020-01-15 DIAGNOSIS — G20 Parkinson's disease: Secondary | ICD-10-CM | POA: Diagnosis not present

## 2020-01-15 DIAGNOSIS — N401 Enlarged prostate with lower urinary tract symptoms: Secondary | ICD-10-CM | POA: Diagnosis not present

## 2020-01-15 DIAGNOSIS — R1310 Dysphagia, unspecified: Secondary | ICD-10-CM | POA: Diagnosis not present

## 2020-01-19 DIAGNOSIS — R339 Retention of urine, unspecified: Secondary | ICD-10-CM | POA: Diagnosis not present

## 2020-01-19 DIAGNOSIS — N401 Enlarged prostate with lower urinary tract symptoms: Secondary | ICD-10-CM | POA: Diagnosis not present

## 2020-01-19 DIAGNOSIS — R1310 Dysphagia, unspecified: Secondary | ICD-10-CM | POA: Diagnosis not present

## 2020-01-19 DIAGNOSIS — R634 Abnormal weight loss: Secondary | ICD-10-CM | POA: Diagnosis not present

## 2020-01-19 DIAGNOSIS — G20 Parkinson's disease: Secondary | ICD-10-CM | POA: Diagnosis not present

## 2020-01-19 DIAGNOSIS — G40909 Epilepsy, unspecified, not intractable, without status epilepticus: Secondary | ICD-10-CM | POA: Diagnosis not present

## 2020-04-17 ENCOUNTER — Telehealth: Payer: Self-pay | Admitting: Family Medicine

## 2020-04-17 NOTE — Telephone Encounter (Signed)
[name] (wife) wants Dr. Carmelia Roller to know her husband passed away on May 05, 2020

## 2020-04-19 DEATH — deceased
